# Patient Record
Sex: Female | Born: 1970 | Race: Black or African American | Hispanic: No | Smoking: Never smoker
Health system: Southern US, Community
[De-identification: ages and names within clinical notes are randomized; demographics above are authoritative.]

## PROBLEM LIST (undated history)

## (undated) DIAGNOSIS — F419 Anxiety disorder, unspecified: Secondary | ICD-10-CM

## (undated) DIAGNOSIS — G4730 Sleep apnea, unspecified: Secondary | ICD-10-CM

## (undated) DIAGNOSIS — E039 Hypothyroidism, unspecified: Secondary | ICD-10-CM

## (undated) DIAGNOSIS — I10 Essential (primary) hypertension: Secondary | ICD-10-CM

## (undated) DIAGNOSIS — M5430 Sciatica, unspecified side: Secondary | ICD-10-CM

## (undated) DIAGNOSIS — F32A Depression, unspecified: Secondary | ICD-10-CM

## (undated) DIAGNOSIS — E669 Obesity, unspecified: Secondary | ICD-10-CM

## (undated) DIAGNOSIS — Z889 Allergy status to unspecified drugs, medicaments and biological substances status: Secondary | ICD-10-CM

## (undated) HISTORY — DX: Obesity, unspecified: E66.9

## (undated) HISTORY — PX: BREAST CYST EXCISION: SHX579

## (undated) HISTORY — PX: BREAST CYST ASPIRATION: SHX578

## (undated) HISTORY — DX: Depression, unspecified: F32.A

## (undated) HISTORY — DX: Anxiety disorder, unspecified: F41.9

## (undated) HISTORY — DX: Hypothyroidism, unspecified: E03.9

## (undated) HISTORY — DX: Sleep apnea, unspecified: G47.30

---

## 2005-02-20 ENCOUNTER — Emergency Department (HOSPITAL_COMMUNITY): Admission: EM | Admit: 2005-02-20 | Discharge: 2005-02-20 | Payer: Self-pay | Admitting: Emergency Medicine

## 2005-07-20 ENCOUNTER — Emergency Department (HOSPITAL_COMMUNITY): Admission: EM | Admit: 2005-07-20 | Discharge: 2005-07-21 | Payer: Self-pay | Admitting: Emergency Medicine

## 2006-02-10 ENCOUNTER — Ambulatory Visit: Payer: Self-pay | Admitting: Family Medicine

## 2006-02-10 ENCOUNTER — Ambulatory Visit (HOSPITAL_COMMUNITY): Admission: RE | Admit: 2006-02-10 | Discharge: 2006-02-10 | Payer: Self-pay | Admitting: Family Medicine

## 2007-04-20 ENCOUNTER — Ambulatory Visit (HOSPITAL_COMMUNITY): Admission: AD | Admit: 2007-04-20 | Discharge: 2007-04-20 | Payer: Self-pay | Admitting: Obstetrics & Gynecology

## 2007-04-22 ENCOUNTER — Inpatient Hospital Stay (HOSPITAL_COMMUNITY): Admission: AD | Admit: 2007-04-22 | Discharge: 2007-04-23 | Payer: Self-pay | Admitting: Obstetrics & Gynecology

## 2007-04-28 ENCOUNTER — Inpatient Hospital Stay (HOSPITAL_COMMUNITY): Admission: AD | Admit: 2007-04-28 | Discharge: 2007-04-30 | Payer: Self-pay | Admitting: Obstetrics & Gynecology

## 2009-08-21 ENCOUNTER — Emergency Department (HOSPITAL_COMMUNITY): Admission: EM | Admit: 2009-08-21 | Discharge: 2009-08-21 | Payer: Self-pay | Admitting: Emergency Medicine

## 2010-05-19 ENCOUNTER — Emergency Department (HOSPITAL_COMMUNITY): Admission: EM | Admit: 2010-05-19 | Discharge: 2010-05-19 | Payer: Self-pay | Admitting: Family Medicine

## 2010-10-02 ENCOUNTER — Emergency Department (HOSPITAL_BASED_OUTPATIENT_CLINIC_OR_DEPARTMENT_OTHER): Admission: EM | Admit: 2010-10-02 | Discharge: 2010-10-02 | Payer: Self-pay | Admitting: Emergency Medicine

## 2011-03-01 LAB — POCT I-STAT, CHEM 8
BUN: 9 mg/dL (ref 6–23)
Calcium, Ion: 1.12 mmol/L (ref 1.12–1.32)
HCT: 40 % (ref 36.0–46.0)
Hemoglobin: 13.6 g/dL (ref 12.0–15.0)
Potassium: 3.2 mEq/L — ABNORMAL LOW (ref 3.5–5.1)
Sodium: 139 mEq/L (ref 135–145)
TCO2: 28 mmol/L (ref 0–100)

## 2011-03-01 LAB — POCT URINALYSIS DIP (DEVICE)
Glucose, UA: NEGATIVE mg/dL
Protein, ur: 100 mg/dL — AB
Specific Gravity, Urine: >=1.03 (ref 1.005–1.030)

## 2011-03-19 LAB — URINALYSIS, ROUTINE W REFLEX MICROSCOPIC: pH: 5.5 (ref 5.0–8.0)

## 2011-03-19 LAB — POCT PREGNANCY, URINE: Preg Test, Ur: NEGATIVE

## 2011-03-19 LAB — URINE MICROSCOPIC-ADD ON

## 2011-04-27 NOTE — H&P (Signed)
NAMEJAMESON, MORROW            ACCOUNT NO.:  [id number]   MEDICAL RECORD NO.:  [id number]          PATIENT TYPE:  INP   LOCATION:  9173                          FACILITY:  [facility]   PHYSICIAN:  Roseanna Rainbow, M.D.DATE OF BIRTH:  29-Jul-1971   DATE OF ADMISSION:  04/28/2007  DATE OF DISCHARGE:                              HISTORY & PHYSICAL   CHIEF COMPLAINT:  Patient is a 40 year old para 2 with an estimated date  of confinement of May 21 with an intrauterine pregnancy of 39+ weeks who  presents for induction of labor.   HISTORY OF PRESENT ILLNESS:  Please see the above.   OBSTETRICAL RISK FACTORS:  Polyhydramnios that resolved.  Obesity.  Asthma.  Hypothyroidism.   ALLERGIES:  PENICILLIN.   MEDICATIONS:  Please see the reconciliation form.   PRENATAL LABS:  Hemoglobin 11.4, hematocrit 36.7, platelets 390,000,  blood type O+, antibody screen negative.  Sickle cell negative.  RPR  nonreactive.  Hepatitis B surface antigen negative.  HIV nonreactive.  Rubella immune.  One hour GGT 123.  Urine culture and sensitivity:  No  growth.  Pap smear negative.  GC probe negative.  Chlamydia probe  negative.  Varicella immune.  Quad screen normal.   PAST GYN HISTORY:  Noncontributory.   PAST MEDICAL HISTORY:  1. Hypothyroidism.  2. Asthma.   PAST SURGICAL HISTORY:  No previous surgery.   SOCIAL HISTORY:  Employed at [employer].  Married.  Living with her  spouse.  Does not give any significant issues, alcohol usage.  Has no  significant smoking history.  Denies illicit drug use.   FAMILY HISTORY:  Hypertension, adult-onset diabetes, breast cancer.   PAST OBSTETRICAL HISTORY:  In January, 2005, she had a spontaneous  abortion.  In January, 2001, she had a spontaneous abortion of a twin  pregnancy.  In January, 1998, she had an ectopic pregnancy.  In March,  1995, she delivered a liveborn female, 8 pounds, full term vaginal  delivery.  No complications.  In September, 1992, she  delivered a 6  pound, 11 ounce female at term, vaginal delivery, no complications.   PHYSICAL EXAMINATION:  VITAL SIGNS:  Stable.  Afebrile.  Fetal heart  tracing reassuring.  Tocodynamometer:  Irregular contractions.  ABDOMEN:  Gravid.  PELVIC:  Sterile vaginal exam: 2 cm dilated/80% effaced, per the RN.   ASSESSMENT:  Multipara at term, although the pregnancy complicated by  hypothyroidism, favorable Bishop's score for induction of labor.  Fetal  heart tracing consistent with fetal well-being.  GBS positive.   PLAN:  Admission.  Low dose Pitocin.  Per protocol, to be followed by  [facility].  Ancef for GBS prophylaxis.  Continue home medications.      Roseanna Rainbow, M.D.  Electronically Signed     LAJ/MEDQ  D:  04/28/2007  T:  04/28/2007  Job:  [id number]

## 2012-05-07 ENCOUNTER — Encounter (HOSPITAL_COMMUNITY): Payer: Self-pay | Admitting: *Deleted

## 2012-05-07 ENCOUNTER — Emergency Department (INDEPENDENT_AMBULATORY_CARE_PROVIDER_SITE_OTHER)
Admission: EM | Admit: 2012-05-07 | Discharge: 2012-05-07 | Disposition: A | Discharge status: Home / Self Care | Payer: Self-pay | Source: Home / Self Care | Attending: Emergency Medicine | Admitting: Emergency Medicine

## 2012-05-07 DIAGNOSIS — J029 Acute pharyngitis, unspecified: Secondary | ICD-10-CM

## 2012-05-07 DIAGNOSIS — J329 Chronic sinusitis, unspecified: Secondary | ICD-10-CM

## 2012-05-07 HISTORY — DX: Hypothyroidism, unspecified: E03.9

## 2012-05-07 HISTORY — DX: Essential (primary) hypertension: I10

## 2012-05-07 HISTORY — DX: Allergy status to unspecified drugs, medicaments and biological substances: Z88.9

## 2012-05-07 MED ORDER — FLUTICASONE PROPIONATE 50 MCG/ACT NA SUSP: 2.0000 | Freq: Every day | NASAL | Status: DC

## 2012-05-07 MED ORDER — IBUPROFEN 600 MG PO TABS: 600.0000 mg | ORAL_TABLET | Freq: Four times a day (QID) | ORAL | Status: AC | PRN

## 2012-05-07 MED ORDER — HYDROCODONE-ACETAMINOPHEN 5-325 MG PO TABS: 2.0000 | ORAL_TABLET | ORAL | Status: AC | PRN

## 2012-05-07 NOTE — Discharge Instructions (Signed)
Take the medication as written. Return if you get worse, have a fever >100.4, or for any concerns. You may take 600 mg of motrin with 1 gram of tylenol up to 4 times a day as needed for pain. This is an effective combination for pain. Use a neti pot or the NeilMed sinus rinse as often as you want to to reduce nasal congestion. Follow the directions on the box.  ° °Go to www.goodrx.com to look up your medications. This will give you a list of where you can find your prescriptions at the most affordable prices.  °

## 2012-05-07 NOTE — ED Provider Notes (Signed)
History     CSN: [id number]  Arrival date & time 05/07/12  1538   First MD Initiated Contact with Patient 05/07/12 1557      Chief Complaint  Patient presents with  . Nasal Congestion  . Cough  . Sore Throat  . Chills  . Fever    (Consider location/radiation/quality/duration/timing/severity/associated sxs/prior treatment) HPI Comments: Pt with rhinorrhea, postnasal drip, ST, nonproductive cough, raspy voice, fatigue, maxillary sinus pressure x 5 days. Reports fevers up beginning in illness, Tmax 101.  Reports burning sensation in chest with coughing. Some wheezing at night. No drooling, trismus, sensation of throat swelling shut. She's been taking over-the-counter medications without relief. No aggravating factors. No neck pain, headache, rash. No ear pain, SOB, abd pain, rash, N/V. Slightly decreased appetite but is tolerating po.  No known sick contacts. She has a history of asthma and seasonal allergies.     ROS as noted in HPI. All other ROS negative.     Patient is a 41 y.o. female presenting with pharyngitis and fever. The history is provided by the patient. No language interpreter was used.  Sore Throat This is a new problem. The current episode started more than 2 days ago. The problem occurs constantly. The problem has not changed since onset.The symptoms are aggravated by swallowing. The symptoms are relieved by nothing. She has tried rest for the symptoms.  Fever Primary symptoms of the febrile illness include fever.    Past Medical History  Diagnosis Date  . Hypertension   . Asthma   . H/O seasonal allergies   . Hypothyroid     History reviewed. No pertinent past surgical history.  History reviewed. No pertinent family history.  History  Substance Use Topics  . Smoking status: Never Smoker   . Smokeless tobacco: Not on file  . Alcohol Use: No    OB History    Grav Para Term Preterm Abortions TAB SAB Ect Mult Living                  Review of  Systems  Constitutional: Positive for fever.    Allergies  Penicillins  Home Medications   Current Outpatient Rx  Name Route Sig Dispense Refill  . ALBUTEROL SULFATE HFA 108 (90 BASE) MCG/ACT IN AERS Inhalation Inhale 2 puffs into the lungs every 6 (six) hours as needed.    Marland Kitchen BIOTIN 5000 MCG PO TABS Oral Take by mouth.    Marland Kitchen HYDROCHLOROTHIAZIDE 25 MG PO TABS Oral Take 25 mg by mouth daily.    . IBUPROFEN 600 MG PO TABS Oral Take 600 mg by mouth every 6 (six) hours as needed.    Marland Kitchen LEVOTHYROXINE SODIUM 200 MCG PO TABS Oral Take 200 mcg by mouth daily.    Marland Kitchen OVER THE COUNTER MEDICATION  Throat spray    . PHENTERMINE HCL 37.5 MG PO CAPS Oral Take 37.5 mg by mouth every morning.    Marland Kitchen FLUTICASONE PROPIONATE 50 MCG/ACT NA SUSP Nasal Place 2 sprays into the nose daily. 16 g 0  . HYDROCODONE-ACETAMINOPHEN 5-325 MG PO TABS Oral Take 2 tablets by mouth every 4 (four) hours as needed for pain. 20 tablet 0  . IBUPROFEN 600 MG PO TABS Oral Take 1 tablet (600 mg total) by mouth every 6 (six) hours as needed for pain. 30 tablet 0    BP 124/85  Pulse 88  Temp(Src) 98.5 F (36.9 C) (Oral)  Resp 18  SpO2 100%  Physical Exam  Nursing note and  vitals reviewed. Constitutional: She is oriented to person, place, and time. She appears well-developed and well-nourished.  HENT:  Head: Normocephalic and atraumatic. No trismus in the jaw.  Right Ear: Tympanic membrane and ear canal normal.  Left Ear: Tympanic membrane and ear canal normal.  Nose: Mucosal edema and rhinorrhea present. Right sinus exhibits maxillary sinus tenderness. Right sinus exhibits no frontal sinus tenderness. Left sinus exhibits maxillary sinus tenderness. Left sinus exhibits no frontal sinus tenderness.  Mouth/Throat: Uvula is midline and mucous membranes are normal. Normal dentition. Posterior oropharyngeal erythema present. No oropharyngeal exudate.       No purulent nasal drainage. Enlarged, erythematous tonsils. No palatal  petechiae  Eyes: Conjunctivae and EOM are normal. Pupils are equal, round, and reactive to light.  Neck: Normal range of motion. Neck supple.  Cardiovascular: Normal rate, regular rhythm and normal heart sounds.   Pulmonary/Chest: Effort normal and breath sounds normal.  Abdominal: Bowel sounds are normal. She exhibits no distension.  Musculoskeletal: Normal range of motion.  Lymphadenopathy:    She has cervical adenopathy.  Neurological: She is alert and oriented to person, place, and time.  Skin: Skin is warm and dry. No rash noted.  Psychiatric: She has a normal mood and affect. Her behavior is normal. Judgment and thought content normal.    ED Course  Procedures (including critical care time)   Labs Reviewed  POCT RAPID STREP A ([facility] ONLY)   No results found.   1. Pharyngitis   2. Sinusitis     Results for orders placed during the hospital encounter of 05/07/12  POCT RAPID STREP A ([facility] ONLY)      Component Value Range   Streptococcus, Group A Screen (Direct) NEGATIVE  NEGATIVE      MDM  Strep negative. Symptoms most likely from postnasal drip. Patient also has a lot of nasal congestion, mild bilateral maxillary sinus tenderness. No fevers in several days,  Tmax was 101. Is 5 days into illness, will treat as if viral at this point with increase fluids, Mucinex, saline nasal irrigation, Flonase. She'll followup with her primary care physician for antibiotics if she does not improve in 5 days. She agrees with this plan  Luiz Blare, MD 05/07/12 1758

## 2012-05-07 NOTE — ED Notes (Addendum)
Pt with c/o sore throat/congestion/chills/fever/chest sore with deep breath and coughing - difficulty swallowing - onset x 6 days - cough mild and intermittent  -

## 2013-06-14 DIAGNOSIS — E039 Hypothyroidism, unspecified: Secondary | ICD-10-CM | POA: Insufficient documentation

## 2013-08-23 DIAGNOSIS — D649 Anemia, unspecified: Secondary | ICD-10-CM | POA: Insufficient documentation

## 2013-11-23 DIAGNOSIS — I10 Essential (primary) hypertension: Secondary | ICD-10-CM | POA: Insufficient documentation

## 2014-12-15 ENCOUNTER — Emergency Department (HOSPITAL_COMMUNITY)
Admission: EM | Admit: 2014-12-15 | Discharge: 2014-12-15 | Disposition: A | Discharge status: Home / Self Care | Payer: Medicaid Other | Attending: Emergency Medicine | Admitting: Emergency Medicine

## 2014-12-15 ENCOUNTER — Encounter (HOSPITAL_COMMUNITY): Payer: Self-pay | Admitting: Emergency Medicine

## 2014-12-15 DIAGNOSIS — E039 Hypothyroidism, unspecified: Secondary | ICD-10-CM | POA: Diagnosis not present

## 2014-12-15 DIAGNOSIS — J45909 Unspecified asthma, uncomplicated: Secondary | ICD-10-CM | POA: Insufficient documentation

## 2014-12-15 DIAGNOSIS — I10 Essential (primary) hypertension: Secondary | ICD-10-CM | POA: Diagnosis not present

## 2014-12-15 DIAGNOSIS — R300 Dysuria: Secondary | ICD-10-CM | POA: Diagnosis present

## 2014-12-15 DIAGNOSIS — Z79899 Other long term (current) drug therapy: Secondary | ICD-10-CM | POA: Insufficient documentation

## 2014-12-15 DIAGNOSIS — Z880 Allergy status to penicillin: Secondary | ICD-10-CM | POA: Insufficient documentation

## 2014-12-15 DIAGNOSIS — N390 Urinary tract infection, site not specified: Secondary | ICD-10-CM | POA: Insufficient documentation

## 2014-12-15 LAB — URINALYSIS, ROUTINE W REFLEX MICROSCOPIC
Bilirubin Urine: NEGATIVE
Glucose, UA: NEGATIVE mg/dL
Ketones, ur: NEGATIVE mg/dL
Nitrite: NEGATIVE
Protein, ur: 100 mg/dL — AB
Specific Gravity, Urine: 1.015 (ref 1.005–1.030)
Urobilinogen, UA: 1 mg/dL (ref 0.0–1.0)
pH: 6.5 (ref 5.0–8.0)

## 2014-12-15 LAB — URINE MICROSCOPIC-ADD ON

## 2014-12-15 MED ORDER — OXYCODONE-ACETAMINOPHEN 5-325 MG PO TABS
2.0000 | ORAL_TABLET | Freq: Once | ORAL | Status: AC
  Administered 2014-12-15: 1 via ORAL
  Filled 2014-12-15: qty 2

## 2014-12-15 MED ORDER — CIPROFLOXACIN HCL 500 MG PO TABS
500.0000 mg | ORAL_TABLET | Freq: Once | ORAL | Status: AC
  Administered 2014-12-15: 500 mg via ORAL
  Filled 2014-12-15: qty 1

## 2014-12-15 MED ORDER — IBUPROFEN 200 MG PO TABS
600.0000 mg | ORAL_TABLET | Freq: Once | ORAL | Status: AC
  Administered 2014-12-15: 600 mg via ORAL
  Filled 2014-12-15: qty 3

## 2014-12-15 MED ORDER — CIPROFLOXACIN HCL 500 MG PO TABS
500.0000 mg | ORAL_TABLET | Freq: Two times a day (BID) | ORAL | Status: DC
Start: 2014-12-15 — End: 2015-09-25

## 2014-12-15 NOTE — ED Notes (Signed)
Pt states that she noticed blood in her urine while at work last night with dysuria. Pt states that she has an IUD and will have periodic bleeding.

## 2014-12-15 NOTE — ED Notes (Addendum)
Pt only given 1 Percocet because she "has a low tolerance for medication." Sts that she may want the second pill later. Pt is A&O and in NAD

## 2014-12-15 NOTE — ED Provider Notes (Signed)
CSN: [id number]     Arrival date & time 12/15/14  4782 History   First MD Initiated Contact with Patient 12/15/14 1014     Chief Complaint  Patient presents with  . Hematuria  . Dysuria     (Consider location/radiation/quality/duration/timing/severity/associated sxs/prior Treatment) HPI   44 year old female with lower abdominal pain. Gradual onset 2 days ago. Pain is relatively constant. Suprapubic and also feels it in her lower back. Does not lateralize. Yesterday she began having some dysuria and also noted hematuria. No fevers or chills. No nausea or vomiting. No vaginal discharge.  Past Medical History  Diagnosis Date  . Hypertension   . Asthma   . H/O seasonal allergies   . Hypothyroid    History reviewed. No pertinent past surgical history. No family history on file. History  Substance Use Topics  . Smoking status: Never Smoker   . Smokeless tobacco: Not on file  . Alcohol Use: No   OB History    No data available     Review of Systems  All systems reviewed and negative, other than as noted in HPI.   Allergies  Penicillins  Home Medications   Prior to Admission medications   Medication Sig Start Date End Date Taking? Authorizing Provider  Biotin 5000 MCG CAPS Take 5,000 mcg by mouth daily.   Yes Historical Provider, MD  hydrochlorothiazide (HYDRODIURIL) 25 MG tablet Take 25 mg by mouth daily.   Yes Historical Provider, MD  ibuprofen (ADVIL,MOTRIN) 200 MG tablet Take 200 mg by mouth every 6 (six) hours as needed for moderate pain.   Yes Historical Provider, MD  levothyroxine (SYNTHROID, LEVOTHROID) 175 MCG tablet Take 175 mcg by mouth daily before breakfast.   Yes Historical Provider, MD  loratadine (CLARITIN) 10 MG tablet Take 10 mg by mouth daily.   Yes Historical Provider, MD  Multiple Vitamin (MULTIVITAMIN WITH MINERALS) TABS tablet Take 1 tablet by mouth daily.   Yes Historical Provider, MD  ciprofloxacin (CIPRO) 500 MG tablet Take 1 tablet (500 mg total)  by mouth every 12 (twelve) hours. 12/15/14   Raeford Razor, MD   BP 126/81 mmHg  Pulse 67  Temp(Src) 97.6 F (36.4 C) (Oral)  Resp 16  SpO2 99% Physical Exam  Constitutional: She appears well-developed and well-nourished. No distress.  HENT:  Head: Normocephalic and atraumatic.  Eyes: Conjunctivae are normal. Right eye exhibits no discharge. Left eye exhibits no discharge.  Neck: Neck supple.  Cardiovascular: Normal rate, regular rhythm and normal heart sounds.  Exam reveals no gallop and no friction rub.   No murmur heard. Pulmonary/Chest: Effort normal and breath sounds normal. No respiratory distress.  Abdominal: Soft. She exhibits no distension. There is no tenderness.  Genitourinary:  No cva tenderness  Musculoskeletal: She exhibits no edema or tenderness.  Neurological: She is alert.  Skin: Skin is warm and dry.  Psychiatric: She has a normal mood and affect. Her behavior is normal. Thought content normal.  Nursing note and vitals reviewed.   ED Course  Procedures (including critical care time) Labs Review Labs Reviewed  URINALYSIS, ROUTINE W REFLEX MICROSCOPIC - Abnormal; Notable for the following:    APPearance TURBID (*)    Hgb urine dipstick LARGE (*)    Protein, ur 100 (*)    Leukocytes, UA LARGE (*)    All other components within normal limits  URINE MICROSCOPIC-ADD ON - Abnormal; Notable for the following:    Bacteria, UA MANY (*)    All other components within normal  limits  URINE CULTURE    Imaging Review No results found.   EKG Interpretation None      MDM   Final diagnoses:  UTI (lower urinary tract infection)    44 year old female with symptoms and urinalysis consistent with UTI. Afebrile. Benign abdominal exam. We'll send urine culture. Penicillin allergy. Ciprofloxacin prescribed. Return precautions discussed.    Raeford Razor, MD 12/15/14 1218

## 2014-12-15 NOTE — Discharge Instructions (Signed)

## 2014-12-18 LAB — URINE CULTURE: Colony Count: >=100000

## 2014-12-19 ENCOUNTER — Telehealth (HOSPITAL_BASED_OUTPATIENT_CLINIC_OR_DEPARTMENT_OTHER): Payer: Self-pay | Admitting: Emergency Medicine

## 2014-12-19 NOTE — Telephone Encounter (Signed)
Post ED Visit - Positive Culture Follow-up  Culture report reviewed by antimicrobial stewardship pharmacist: []  Wes Dulaney, Pharm.D., BCPS []  Celedonio MiyamotoJeremy Frens, Pharm.D., BCPS [x]  Georgina PillionElizabeth Martin, Pharm.D., BCPS []  ArbyrdMinh Pham, [address]Ph[address]D., BCPS, AAHIVP []  Estella HuskMichelle Turner, Pharm.D., BCPS, AAHIVP []  Elder CyphersLorie Poole, [address]Pharm[address]D., BCPS  Positive urine culture Proteus Treated with ciprofloxacin, organism sensitive to the same and no further patient follow-up is required at this time.  Berle MullMiller, Gizelle Whetsel 12/19/2014, 11:20 AM

## 2015-07-21 ENCOUNTER — Ambulatory Visit: Payer: Medicaid Other | Admitting: Obstetrics

## 2015-07-30 ENCOUNTER — Ambulatory Visit: Payer: Medicaid Other | Admitting: Obstetrics

## 2015-08-05 ENCOUNTER — Ambulatory Visit (INDEPENDENT_AMBULATORY_CARE_PROVIDER_SITE_OTHER): Payer: Medicaid Other | Admitting: Certified Nurse Midwife

## 2015-08-05 ENCOUNTER — Encounter: Payer: Self-pay | Admitting: Certified Nurse Midwife

## 2015-08-05 VITALS — BP 123/83 | HR 79 | Temp 97.9°F | Wt 342.0 lb

## 2015-08-05 DIAGNOSIS — Z113 Encounter for screening for infections with a predominantly sexual mode of transmission: Secondary | ICD-10-CM | POA: Diagnosis not present

## 2015-08-05 DIAGNOSIS — A499 Bacterial infection, unspecified: Secondary | ICD-10-CM | POA: Diagnosis not present

## 2015-08-05 DIAGNOSIS — Z0000 Encounter for general adult medical examination without abnormal findings: Secondary | ICD-10-CM | POA: Diagnosis not present

## 2015-08-05 DIAGNOSIS — B9689 Other specified bacterial agents as the cause of diseases classified elsewhere: Secondary | ICD-10-CM

## 2015-08-05 DIAGNOSIS — N760 Acute vaginitis: Secondary | ICD-10-CM | POA: Diagnosis not present

## 2015-08-05 DIAGNOSIS — Z01419 Encounter for gynecological examination (general) (routine) without abnormal findings: Secondary | ICD-10-CM

## 2015-08-05 LAB — CBC WITH DIFFERENTIAL/PLATELET
Basophils Absolute: 0 10*3/uL (ref 0.0–0.1)
Basophils Relative: 0 % (ref 0–1)
EOS ABS: 0.2 10*3/uL (ref 0.0–0.7)
Eosinophils Relative: 3 % (ref 0–5)
HEMATOCRIT: 40 % (ref 36.0–46.0)
HEMOGLOBIN: 13 g/dL (ref 12.0–15.0)
LYMPHS ABS: 2.8 10*3/uL (ref 0.7–4.0)
LYMPHS PCT: 39 % (ref 12–46)
MCH: 28 pg (ref 26.0–34.0)
MCHC: 32.5 g/dL (ref 30.0–36.0)
MCV: 86 fL (ref 78.0–100.0)
MONOS PCT: 7 % (ref 3–12)
MPV: 9.9 fL (ref 8.6–12.4)
Monocytes Absolute: 0.5 10*3/uL (ref 0.1–1.0)
NEUTROS PCT: 51 % (ref 43–77)
Neutro Abs: 3.7 10*3/uL (ref 1.7–7.7)
PLATELETS: 301 10*3/uL (ref 150–400)
RBC: 4.65 MIL/uL (ref 3.87–5.11)
RDW: 14.4 % (ref 11.5–15.5)
WBC: 7.2 10*3/uL (ref 4.0–10.5)

## 2015-08-05 LAB — COMPREHENSIVE METABOLIC PANEL
ALT: 14 U/L (ref 6–29)
AST: 15 U/L (ref 10–30)
Albumin: 3.8 g/dL (ref 3.6–5.1)
Alkaline Phosphatase: 59 U/L (ref 33–115)
BUN: 10 mg/dL (ref 7–25)
CHLORIDE: 99 mmol/L (ref 98–110)
CO2: 25 mmol/L (ref 20–31)
Calcium: 8.9 mg/dL (ref 8.6–10.2)
Creat: 0.56 mg/dL (ref 0.50–1.10)
GLUCOSE: 95 mg/dL (ref 65–99)
POTASSIUM: 3.8 mmol/L (ref 3.5–5.3)
Sodium: 139 mmol/L (ref 135–146)
Total Bilirubin: 0.3 mg/dL (ref 0.2–1.2)
Total Protein: 7.1 g/dL (ref 6.1–8.1)

## 2015-08-05 LAB — CHOLESTEROL, TOTAL: Cholesterol: 150 mg/dL (ref 125–200)

## 2015-08-05 LAB — HDL CHOLESTEROL: HDL: 50 mg/dL (ref >=46)

## 2015-08-05 LAB — HEMOGLOBIN A1C
HEMOGLOBIN A1C: 6.1 % — AB (ref <5.7)
MEAN PLASMA GLUCOSE: 128 mg/dL — AB (ref <117)

## 2015-08-05 LAB — TRIGLYCERIDES: Triglycerides: 75 mg/dL (ref <150)

## 2015-08-05 MED ORDER — METRONIDAZOLE 500 MG PO TABS
500.0000 mg | ORAL_TABLET | Freq: Two times a day (BID) | ORAL | Status: DC
Start: 2015-08-05 — End: 2018-04-05

## 2015-08-05 MED ORDER — VITAFOL ULTRA 29-0.6-0.4-200 MG PO CAPS: 1.0000 | ORAL_CAPSULE | Freq: Every day | ORAL | Status: DC

## 2015-08-05 MED ORDER — PHENTERMINE HCL 37.5 MG PO TABS: 37.5000 mg | ORAL_TABLET | Freq: Every day | ORAL | Status: DC
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
## 2015-08-06 LAB — HIV ANTIBODY (ROUTINE TESTING W REFLEX): HIV 1&2 Ab, 4th Generation: NONREACTIVE

## 2015-08-06 LAB — HEPATITIS C ANTIBODY: HCV Ab: NEGATIVE

## 2015-08-06 LAB — HEPATITIS B SURFACE ANTIGEN: Hepatitis B Surface Ag: NEGATIVE

## 2015-08-06 LAB — RPR

## 2015-08-08 LAB — SURESWAB, VAGINOSIS/VAGINITIS PLUS
ATOPOBIUM VAGINAE: 6.6 Log (cells/mL)
C. ALBICANS, DNA: NOT DETECTED
C. GLABRATA, DNA: NOT DETECTED
C. PARAPSILOSIS, DNA: NOT DETECTED
C. TRACHOMATIS RNA, TMA: NOT DETECTED
C. tropicalis, DNA: NOT DETECTED
Gardnerella vaginalis: >8 Log (cells/mL)
LACTOBACILLUS SPECIES: NOT DETECTED Log (cells/mL)
MEGASPHAERA SPECIES: >8 Log (cells/mL)
N. GONORRHOEAE RNA, TMA: NOT DETECTED
T. VAGINALIS RNA, QL TMA: DETECTED — AB

## 2015-08-08 LAB — PAP, TP IMAGING W/ HPV RNA, RFLX HPV TYPE 16,18/45: HPV mRNA, High Risk: NOT DETECTED

## 2015-08-11 ENCOUNTER — Ambulatory Visit: Payer: Medicaid Other

## 2015-09-01 ENCOUNTER — Ambulatory Visit: Payer: Medicaid Other

## 2015-09-09 ENCOUNTER — Ambulatory Visit: Payer: Medicaid Other

## 2015-09-23 ENCOUNTER — Ambulatory Visit: Payer: Medicaid Other

## 2015-09-25 ENCOUNTER — Encounter (HOSPITAL_COMMUNITY): Payer: Self-pay | Admitting: Emergency Medicine

## 2015-09-25 ENCOUNTER — Emergency Department (INDEPENDENT_AMBULATORY_CARE_PROVIDER_SITE_OTHER): Payer: Medicaid Other

## 2015-09-25 ENCOUNTER — Emergency Department (HOSPITAL_COMMUNITY)
Admission: EM | Admit: 2015-09-25 | Discharge: 2015-09-25 | Disposition: A | Discharge status: Home / Self Care | Payer: Medicaid Other | Source: Home / Self Care | Attending: Emergency Medicine | Admitting: Emergency Medicine

## 2015-09-25 DIAGNOSIS — S29012A Strain of muscle and tendon of back wall of thorax, initial encounter: Secondary | ICD-10-CM | POA: Diagnosis not present

## 2015-09-25 MED ORDER — CYCLOBENZAPRINE HCL 5 MG PO TABS: 5.0000 mg | ORAL_TABLET | Freq: Every day | ORAL | Status: DC

## 2015-09-25 MED ORDER — PREDNISONE 50 MG PO TABS: ORAL_TABLET | ORAL | Status: DC

## 2015-09-25 MED ORDER — TRAMADOL HCL 50 MG PO TABS: 50.0000 mg | ORAL_TABLET | Freq: Four times a day (QID) | ORAL | Status: DC | PRN

## 2015-09-25 NOTE — ED Provider Notes (Signed)
CSN: [id number]     Arrival date & time 09/25/15  1649 History   First MD Initiated Contact with Patient 09/25/15 1810     Chief Complaint  Patient presents with  . Shoulder Pain   (Consider location/radiation/quality/duration/timing/severity/associated sxs/prior Treatment) HPI She is a 44 year old woman here for evaluation of upper back pain. She states this started 3 days ago. She denies any injury or trauma. No new activities or heavy lifting. She states it is very sore across the top of her back. It also wraps around under her left arm and into the left chest wall. No radiating pain down the arms. No numbness, tingling, weakness. She does report an intermittent cough, which makes the pain worse. No shortness of breath. She reports a subjective fever yesterday. No rashes. No leg pain or swelling. No prolonged immobilization. No oral estrogen.  She has taken some ibuprofen with temporary improvement.  Past Medical History  Diagnosis Date  . Hypertension   . Asthma   . H/O seasonal allergies   . Hypothyroid    History reviewed. No pertinent past surgical history. History reviewed. No pertinent family history. Social History  Substance Use Topics  . Smoking status: Never Smoker   . Smokeless tobacco: None  . Alcohol Use: No   OB History    No data available     Review of Systems As in history of present illness Allergies  Penicillins  Home Medications   Prior to Admission medications   Medication Sig Start Date End Date Taking? Authorizing Provider  Biotin 5000 MCG CAPS Take 5,000 mcg by mouth daily.    Historical Provider, MD  cyclobenzaprine (FLEXERIL) 5 MG tablet Take 1-2 tablets (5-10 mg total) by mouth at bedtime. 09/25/15   Charm RingsErin J Kazuo Durnil, MD  hydrochlorothiazide (HYDRODIURIL) 25 MG tablet Take 25 mg by mouth daily.    Historical Provider, MD  ibuprofen (ADVIL,MOTRIN) 200 MG tablet Take 200 mg by mouth every 6 (six) hours as needed for moderate pain.    Historical  Provider, MD  levothyroxine (SYNTHROID, LEVOTHROID) 175 MCG tablet Take 175 mcg by mouth daily before breakfast.    Historical Provider, MD  levothyroxine (SYNTHROID, LEVOTHROID) 50 MCG tablet Take 250 mcg by mouth daily before breakfast.    Historical Provider, MD  loratadine (CLARITIN) 10 MG tablet Take 10 mg by mouth daily.    Historical Provider, MD  metroNIDAZOLE (FLAGYL) 500 MG tablet Take 1 tablet (500 mg total) by mouth 2 (two) times daily. 08/05/15   Roe Coombsachelle A Denney, CNM  Multiple Vitamin (MULTIVITAMIN WITH MINERALS) TABS tablet Take 1 tablet by mouth daily.    Historical Provider, MD  phentermine (ADIPEX-P) 37.5 MG tablet Take 1 tablet (37.5 mg total) by mouth daily before breakfast. 08/05/15   Rachelle A Denney, CNM  predniSONE (DELTASONE) 50 MG tablet Take 1 pill daily for 5 days. 09/25/15   Charm RingsErin J Mylez Venable, MD  Prenat-Fe Poly-Methfol-FA-DHA (VITAFOL ULTRA) 29-0.6-0.4-200 MG CAPS Take 1 tablet by mouth daily at 10 pm. 08/05/15   Roe Coombsachelle A Denney, CNM  traMADol (ULTRAM) 50 MG tablet Take 1 tablet (50 mg total) by mouth every 6 (six) hours as needed. 09/25/15   Charm RingsErin J Lillah Standre, MD   Meds Ordered and Administered this Visit  Medications - No data to display  BP 168/80 mmHg  Pulse 94  Temp(Src) 98.3 F (36.8 C) (Oral)  Resp 16  SpO2 98% No data found.   Physical Exam  Constitutional: She is oriented to person, place, and  time. She appears well-developed and well-nourished. No distress.  Neck: Neck supple.  Cardiovascular: Normal rate, regular rhythm and normal heart sounds.   No murmur heard. Pulmonary/Chest: Effort normal and breath sounds normal. No respiratory distress. She has no wheezes. She has no rales.  Musculoskeletal: She exhibits no edema or tenderness.       Back:       Arms: No vertebral tenderness or step-offs. 5 out of 5 strength in abduction and grip bilaterally.  Areas of tenderness to palpation outlined in red.  Neurological: She is alert and oriented to person,  place, and time.    ED Course  Procedures (including critical care time)  Labs Review Labs Reviewed - No data to display  Imaging Review Dg Chest 2 View  09/25/2015  CLINICAL DATA:  44 year old female with cough fever and spine pain for 4 days. Initial encounter. EXAM: CHEST  2 VIEW COMPARISON:  None. FINDINGS: Large body habitus. Cardiomegaly. Other mediastinal contours are within normal limits. Somewhat low lung volumes, especially on the lateral view. No pneumothorax, pulmonary edema, pleural effusion or confluent pulmonary opacity. No acute osseous abnormality identified. IMPRESSION: Cardiomegaly and low lung volumes. No acute cardiopulmonary abnormality. Electronically Signed   By: Odessa Fleming M.D.   On: 09/25/2015 18:58      MDM   1. Muscle strain of left upper back, initial encounter   2. Muscle strain of right upper back, initial encounter    Exam is consistent with muscle tenderness.  No history or exam findings concerning for PE or lung pathology. Treat with prednisone, Flexeril, and tramadol as needed. I discussed her x-ray results showing cardiomegaly. Recommended that she follow-up with her primary care doctor to see if he wants to do an ultrasound.  Charm Rings, MD 09/25/15 [phone]

## 2015-09-25 NOTE — Discharge Instructions (Signed)
Your pain is coming from the muscles of your upper back and rib cage. Apply heat several times a day. Take prednisone daily for 5 days. This is to help get rid of any inflammation. Use ibuprofen as needed during the day. Take Flexeril 1-2 tablets at bedtime for muscle spasm. This medicine will make you sleepy. Use the tramadol every 6-8 hours as needed for pain. Do not drive for taking this medicine. Follow-up as needed.  Please follow-up with your primary care doctor in the next few weeks as you are chest x-ray showed an enlarged heart. Your doctor may want to get an ultrasound.

## 2015-09-25 NOTE — ED Notes (Signed)
C/o bilateral back pain radiating to shoulders for a few days  Ibuprofen used as tx Denies any injury

## 2017-01-17 ENCOUNTER — Ambulatory Visit: Payer: Medicaid Other | Admitting: Obstetrics and Gynecology

## 2017-03-14 ENCOUNTER — Encounter (HOSPITAL_COMMUNITY): Payer: Self-pay | Admitting: Emergency Medicine

## 2017-03-14 DIAGNOSIS — E039 Hypothyroidism, unspecified: Secondary | ICD-10-CM | POA: Diagnosis not present

## 2017-03-14 DIAGNOSIS — M545 Low back pain: Secondary | ICD-10-CM | POA: Diagnosis present

## 2017-03-14 DIAGNOSIS — J45909 Unspecified asthma, uncomplicated: Secondary | ICD-10-CM | POA: Insufficient documentation

## 2017-03-14 DIAGNOSIS — M5441 Lumbago with sciatica, right side: Secondary | ICD-10-CM | POA: Insufficient documentation

## 2017-03-14 DIAGNOSIS — I10 Essential (primary) hypertension: Secondary | ICD-10-CM | POA: Insufficient documentation

## 2017-03-14 DIAGNOSIS — Z79899 Other long term (current) drug therapy: Secondary | ICD-10-CM | POA: Diagnosis not present

## 2017-03-14 DIAGNOSIS — M5442 Lumbago with sciatica, left side: Secondary | ICD-10-CM | POA: Insufficient documentation

## 2017-03-14 MED ORDER — OXYCODONE-ACETAMINOPHEN 5-325 MG PO TABS
1.0000 | ORAL_TABLET | Freq: Once | ORAL | Status: AC
  Administered 2017-03-14: 1 via ORAL

## 2017-03-14 MED ORDER — OXYCODONE-ACETAMINOPHEN 5-325 MG PO TABS
ORAL_TABLET | ORAL | Status: AC
  Filled 2017-03-14: qty 1

## 2017-03-14 NOTE — ED Triage Notes (Signed)
Pt. reports low back pain radiating down to both legs onset today , denies injury or fall , pain increases with movement / changing positions , denies hematuria or dysuria . Pt. stated history of sciatica unrelieved by OTC Aleve .

## 2017-03-15 ENCOUNTER — Emergency Department (HOSPITAL_COMMUNITY): Payer: BC Managed Care – PPO

## 2017-03-15 ENCOUNTER — Emergency Department (HOSPITAL_COMMUNITY)
Admission: EM | Admit: 2017-03-15 | Discharge: 2017-03-15 | Disposition: A | Discharge status: Home / Self Care | Payer: BC Managed Care – PPO | Attending: Emergency Medicine | Admitting: Emergency Medicine

## 2017-03-15 DIAGNOSIS — M5442 Lumbago with sciatica, left side: Secondary | ICD-10-CM

## 2017-03-15 DIAGNOSIS — M5441 Lumbago with sciatica, right side: Secondary | ICD-10-CM

## 2017-03-15 HISTORY — DX: Sciatica, unspecified side: M54.30

## 2017-03-15 MED ORDER — HYDROMORPHONE HCL 1 MG/ML IJ SOLN
0.5000 mg | Freq: Once | INTRAMUSCULAR | Status: AC
  Administered 2017-03-15: 1 mg via INTRAVENOUS
  Filled 2017-03-15: qty 1

## 2017-03-15 MED ORDER — ONDANSETRON HCL 4 MG/2ML IJ SOLN
4.0000 mg | Freq: Once | INTRAMUSCULAR | Status: AC
  Administered 2017-03-15: 4 mg via INTRAVENOUS
  Filled 2017-03-15: qty 2

## 2017-03-15 MED ORDER — METHOCARBAMOL 500 MG PO TABS: 1000.0000 mg | ORAL_TABLET | Freq: Four times a day (QID) | ORAL | 0 refills | Status: DC

## 2017-03-15 MED ORDER — METHOCARBAMOL 500 MG PO TABS
500.0000 mg | ORAL_TABLET | Freq: Once | ORAL | Status: AC
  Administered 2017-03-15: 500 mg via ORAL
  Filled 2017-03-15: qty 1

## 2017-03-15 MED ORDER — PREDNISONE 20 MG PO TABS
60.0000 mg | ORAL_TABLET | Freq: Once | ORAL | Status: AC
  Administered 2017-03-15: 60 mg via ORAL
  Filled 2017-03-15: qty 3

## 2017-03-15 MED ORDER — KETOROLAC TROMETHAMINE 30 MG/ML IJ SOLN
30.0000 mg | Freq: Once | INTRAMUSCULAR | Status: AC
  Administered 2017-03-15: 30 mg via INTRAVENOUS
  Filled 2017-03-15: qty 1

## 2017-03-15 MED ORDER — HYDROMORPHONE HCL 1 MG/ML IJ SOLN
0.5000 mg | Freq: Once | INTRAMUSCULAR | Status: AC
  Administered 2017-03-15: 0.5 mg via INTRAVENOUS
  Filled 2017-03-15: qty 1

## 2017-03-15 MED ORDER — TRAMADOL HCL 50 MG PO TABS: 50.0000 mg | ORAL_TABLET | Freq: Four times a day (QID) | ORAL | 0 refills | Status: DC | PRN

## 2017-03-15 MED ORDER — NAPROXEN 500 MG PO TABS: 500.0000 mg | ORAL_TABLET | Freq: Two times a day (BID) | ORAL | 0 refills | Status: DC

## 2017-03-15 MED ORDER — PREDNISONE 20 MG PO TABS: ORAL_TABLET | ORAL | 0 refills | Status: DC

## 2017-03-15 NOTE — ED Provider Notes (Signed)
[facility]-EMERGENCY DEPT Provider Note   CSN: [id number] Arrival date & time: 03/14/17  2228     History   Chief Complaint Chief Complaint  Patient presents with  . Back Pain    Sciatica    HPI April Ortega is a 46 y.o. female.  Patient presents with complaint of gradual onset, acute, sharp, low back pain starting yesterday. Pain was milder and worsened throughout the day. It radiated into both buttocks and down both legs but worse on the right. Patient did not have any preceding trauma. Pain is worse with movements, standing, walking. The pain was so bad that she could not walk and had to call an ambulance. She denies urinary symptoms. She has taken over-the-counter NSAIDs without relief. She had 1 previous episode of sciatica while she was pregnant. No other history of back problems. Patient denies warning symptoms of back pain including: fecal incontinence, urinary retention or overflow incontinence, night sweats, waking from sleep with back pain, unexplained fevers or weight loss, h/o cancer, IVDU, recent trauma. No history of diabetes or immunocompromise.       Past Medical History:  Diagnosis Date  . Asthma   . H/O seasonal allergies   . Hypertension   . Hypothyroid   . Sciatica     Patient Active Problem List   Diagnosis Date Noted  . Morbid obesity (HCC) 08/05/2015    History reviewed. No pertinent surgical history.  OB History    No data available       Home Medications    Prior to Admission medications   Medication Sig Start Date End Date Taking? Authorizing Provider  Biotin 5000 MCG CAPS Take 5,000 mcg by mouth daily.    Historical Provider, MD  cyclobenzaprine (FLEXERIL) 5 MG tablet Take 1-2 tablets (5-10 mg total) by mouth at bedtime. 09/25/15   Charm Rings, MD  hydrochlorothiazide (HYDRODIURIL) 25 MG tablet Take 25 mg by mouth daily.    Historical Provider, MD  ibuprofen (ADVIL,MOTRIN) 200 MG tablet Take 200 mg by mouth every 6 (six) hours as needed  for moderate pain.    Historical Provider, MD  levothyroxine (SYNTHROID, LEVOTHROID) 175 MCG tablet Take 175 mcg by mouth daily before breakfast.    Historical Provider, MD  levothyroxine (SYNTHROID, LEVOTHROID) 50 MCG tablet Take 250 mcg by mouth daily before breakfast.    Historical Provider, MD  loratadine (CLARITIN) 10 MG tablet Take 10 mg by mouth daily.    Historical Provider, MD  metroNIDAZOLE (FLAGYL) 500 MG tablet Take 1 tablet (500 mg total) by mouth 2 (two) times daily. 08/05/15   Roe Coombs, CNM  Multiple Vitamin (MULTIVITAMIN WITH MINERALS) TABS tablet Take 1 tablet by mouth daily.    Historical Provider, MD  phentermine (ADIPEX-P) 37.5 MG tablet Take 1 tablet (37.5 mg total) by mouth daily before breakfast. 08/05/15   Rachelle A Denney, CNM  predniSONE (DELTASONE) 50 MG tablet Take 1 pill daily for 5 days. 09/25/15   Charm Rings, MD  Prenat-Fe Poly-Methfol-FA-DHA (VITAFOL ULTRA) 29-0.6-0.4-200 MG CAPS Take 1 tablet by mouth daily at 10 pm. 08/05/15   Roe Coombs, CNM  traMADol (ULTRAM) 50 MG tablet Take 1 tablet (50 mg total) by mouth every 6 (six) hours as needed. 09/25/15   Charm Rings, MD    Family History No family history on file.  Social History Social History  Substance Use Topics  . Smoking status: Never Smoker  . Smokeless tobacco: Never Used  . Alcohol use No  Allergies   Sulfa antibiotics and Penicillins   Review of Systems Review of Systems  Constitutional: Negative for fever and unexpected weight change.  Gastrointestinal: Negative for constipation.       Negative for fecal incontinence.   Genitourinary: Negative for dysuria, flank pain, hematuria, pelvic pain, vaginal bleeding and vaginal discharge.       Negative for urinary incontinence or retention.  Musculoskeletal: Positive for back pain.  Neurological: Negative for weakness and numbness.       Denies saddle paresthesias.     Physical Exam Updated Vital Signs BP (!) 144/77 (BP  Location: Left Arm)   Pulse 71   Temp 98.1 F (36.7 C) (Oral)   Resp 16   Ht  (1.651 m)   Wt (!) 140.6 kg   SpO2 96%   BMI 51.59 kg/m   Physical Exam  Constitutional: She appears well-developed and well-nourished.  HENT:  Head: Normocephalic and atraumatic.  Eyes: Conjunctivae are normal.  Neck: Normal range of motion. Neck supple.  Pulmonary/Chest: Effort normal.  Abdominal: Soft. There is no tenderness. There is no CVA tenderness.  Musculoskeletal: Normal range of motion.       Cervical back: She exhibits normal range of motion and no tenderness.       Thoracic back: She exhibits normal range of motion, no tenderness and no bony tenderness.       Lumbar back: She exhibits tenderness (Bilateral paraspinous). She exhibits normal range of motion and no bony tenderness.  No step-off noted with palpation of spine.   Neurological: She is alert. She has normal strength and normal reflexes. No sensory deficit.  5/5 strength in entire lower extremities bilaterally. No sensation deficit.   Skin: Skin is warm and dry. No rash noted.  Psychiatric: She has a normal mood and affect.  Nursing note and vitals reviewed.    ED Treatments / Results  Labs (all labs ordered are listed, but only abnormal results are displayed) Labs Reviewed - No data to display  EKG  EKG Interpretation None       Radiology Dg Lumbar Spine Complete  Result Date: 03/15/2017 CLINICAL DATA:  Low back pain radiating into the lower extremities, onset yesterday. No trauma. EXAM: LUMBAR SPINE - COMPLETE 4+ VIEW COMPARISON:  None. FINDINGS: The lumbar vertebrae are normal in height. No spondylolysis. No spondylolisthesis. Mild degenerative lumbar disc changes at L3-4. Facet articulations are well preserved bilaterally. Sacroiliac joints are unremarkable. IMPRESSION: Mild degenerative lumbar disc changes at L3-4. Electronically Signed   By: Ellery Plunk M.D.   On: 03/15/2017 04:56     Procedures Procedures (including critical care time)  Medications Ordered in ED Medications  oxyCODONE-acetaminophen (PERCOCET/ROXICET) 5-325 MG per tablet (not administered)  oxyCODONE-acetaminophen (PERCOCET/ROXICET) 5-325 MG per tablet 1 tablet (1 tablet Oral Given 03/14/17 2237)  ketorolac (TORADOL) 30 MG/ML injection 30 mg (30 mg Intravenous Given 03/15/17 0457)  HYDROmorphone (DILAUDID) injection 0.5 mg (0.5 mg Intravenous Given 03/15/17 0457)  ondansetron (ZOFRAN) injection 4 mg (4 mg Intravenous Given 03/15/17 0457)  methocarbamol (ROBAXIN) tablet 500 mg (500 mg Oral Given 03/15/17 0449)     Initial Impression / Assessment and Plan / ED Course  I have reviewed the triage vital signs and the nursing notes.  Pertinent labs & imaging results that were available during my care of the patient were reviewed by me and considered in my medical decision making (see chart for details).     4:27 AM Patient seen and examined. Work-up initiated. Medications  ordered. No red flags at this time, do not feet that emergent MRI indicated given history and exam. Only atypical feature is pain into both legs. Given that she does not have history of chronic back issues, will obtain lumbar films while pain control undertaken.   Vital signs reviewed and are as follows: Vitals:   03/14/17 2232 03/15/17 0349  BP: 133/67 (!) 144/77  Pulse: 67 71  Resp: 16 16  Temp: 98.1 F (36.7 C)    5:44 AM Patient updated on results. She states she is feeling a bit better. Will d/c after she ambulates.   6:15 AM Patient has ambulated slowly. She states that she feels like her right leg will give out. Her pain is 7 out of 10. We discussed discharge versus continued symptom management. She does have friends who can help her out home. She would like additional medications at this time prior to discharge. Patient reexamined and exam is stable.  Sign out to [employer] at shift change. Additional dose of Dilaudid ordered.  Will also treat with course of steroids given radicular nature.  Patient will need to be reevaluated and I suspect she will go home with symptom management. If for some reason she cannot walk or symptoms worsen, consider MRI of lumbar spine.  Rx for naproxen, tramadol, prednisone, Robaxin.  Patient counseled on use of narcotic pain medications and muscle relaxer. Counseled not to combine these medications with others containing tylenol. Urged not to drink alcohol, drive, or perform any other activities that requires focus while taking these medications. The patient verbalizes understanding and agrees with the plan.  Patient urged to follow-up with PCP if pain does not improve with treatment and rest or if pain becomes recurrent. Urged to return with worsening severe pain, loss of bowel or bladder control, trouble walking.      Final Clinical Impressions(s) / ED Diagnoses   Final diagnoses:  Acute bilateral low back pain with bilateral sciatica   Patient with back pain With radicular features. Radiculopathy is worse on the right.. No neurological deficits other than trace right lower extremity weakness. Patient is ambulatory. No warning symptoms of back pain including: fecal incontinence, urinary retention or overflow incontinence, night sweats, waking from sleep with back pain, unexplained fevers or weight loss, h/o cancer, IVDU, recent trauma. No concern for cauda equina, epidural abscess, or other serious cause of back pain. Conservative measures such as rest, ice/heat and pain medicine indicated with PCP follow-up if no improvement with conservative management.     New Prescriptions New Prescriptions   METHOCARBAMOL (ROBAXIN) 500 MG TABLET    Take 2 tablets (1,000 mg total) by mouth 4 (four) times daily.   NAPROXEN (NAPROSYN) 500 MG TABLET    Take 1 tablet (500 mg total) by mouth 2 (two) times daily.   PREDNISONE (DELTASONE) 20 MG TABLET    3 Tabs PO Days 1-3, then 2 tabs PO Days 4-6,  then 1 tab PO Day 7-9, then Half Tab PO Day 10-12   TRAMADOL (ULTRAM) 50 MG TABLET    Take 1 tablet (50 mg total) by mouth every 6 (six) hours as needed.     Renne Crigler, PA-C 03/15/17 0701    Layla Maw Ward, DO 03/15/17 [phone]

## 2017-03-15 NOTE — Discharge Instructions (Signed)
Please read and follow all provided instructions.  Your diagnoses today include:  1. Acute bilateral low back pain with bilateral sciatica     Tests performed today include:  Vital signs - see below for your results today  Lower back x-ray - shows mild narrowing between L3-L4  Medications prescribed:   Robaxin (methocarbamol) - muscle relaxer medication  DO NOT drive or perform any activities that require you to be awake and alert because this medicine can make you drowsy.    Tramadol - narcotic-like pain medication  DO NOT drive or perform any activities that require you to be awake and alert because this medicine can make you drowsy.    Naproxen - anti-inflammatory pain medication  Do not exceed  naproxen every 12 hours, take with food  You have been prescribed an anti-inflammatory medication or NSAID. Take with food. Take smallest effective dose for the shortest duration needed for your pain. Stop taking if you experience stomach pain or vomiting.   Take any prescribed medications only as directed.  Home care instructions:   Follow any educational materials contained in this packet  Please rest, use ice or heat on your back for the next several days  Do not lift, push, pull anything more than 10 pounds for the next week  Follow-up instructions: Please follow-up with your primary care provider in the next 5 days for further evaluation of your symptoms.   Return instructions:  SEEK IMMEDIATE MEDICAL ATTENTION IF YOU HAVE:  New numbness, tingling, weakness, or problem with the use of your arms or legs  Severe back pain not relieved with medications  Loss control of your bowels or bladder  Increasing pain in any areas of the body (such as chest or abdominal pain)  Shortness of breath, dizziness, or fainting.   Worsening nausea (feeling sick to your stomach), vomiting, fever, or sweats  Any other emergent concerns regarding your health   Additional  Information:  Your vital signs today were: BP (!) 144/77 (BP Location: Left Arm)    Pulse 71    Temp 98.1 F (36.7 C) (Oral)    Resp 16    Ht  (1.651 m)    Wt (!) 140.6 kg    SpO2 96%    BMI 51.59 kg/m  If your blood pressure (BP) was elevated above 135/85 this visit, please have this repeated by your doctor within one month. --------------

## 2017-03-15 NOTE — ED Notes (Signed)
Pt ambulated in hall. Pt complained of pain score being 7 out of 10. Pt demonstrated difficulty to get out of bed and walk stating her leg was also feeling weak. Moderate assistance needed to get out of bed. Standby assist needed while ambulating.

## 2017-03-15 NOTE — ED Notes (Signed)
Pt in triage  

## 2017-03-15 NOTE — ED Notes (Signed)
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 States the pain is currently 7/10 . Denies numbness and tingling to lower ext. At present.;

## 2017-03-15 NOTE — ED Provider Notes (Signed)
Patient signed out to me by Maxie Better PA-C. She is a 46 year old female presents with acute onset of back pain. No red flags. Will plan for pain control and d/c to PCP.  On recheck, pt reports being more comfortable. She was ambulated by nursing staff again and needed minimal assistance. Will d/c.   Bethel Born, PA-C 03/15/17 0848    Layla Maw Ward, DO 03/15/17 2314

## 2017-09-29 ENCOUNTER — Ambulatory Visit (INDEPENDENT_AMBULATORY_CARE_PROVIDER_SITE_OTHER): Payer: Self-pay | Admitting: Orthopedic Surgery

## 2017-10-11 ENCOUNTER — Ambulatory Visit (INDEPENDENT_AMBULATORY_CARE_PROVIDER_SITE_OTHER): Payer: BC Managed Care – PPO

## 2017-10-11 ENCOUNTER — Ambulatory Visit (INDEPENDENT_AMBULATORY_CARE_PROVIDER_SITE_OTHER): Payer: BC Managed Care – PPO | Admitting: Orthopedic Surgery

## 2017-10-11 ENCOUNTER — Encounter (INDEPENDENT_AMBULATORY_CARE_PROVIDER_SITE_OTHER): Payer: Self-pay | Admitting: Orthopedic Surgery

## 2017-10-11 DIAGNOSIS — M5441 Lumbago with sciatica, right side: Secondary | ICD-10-CM

## 2017-10-11 DIAGNOSIS — M542 Cervicalgia: Secondary | ICD-10-CM | POA: Diagnosis not present

## 2017-10-11 DIAGNOSIS — M5442 Lumbago with sciatica, left side: Secondary | ICD-10-CM | POA: Diagnosis not present

## 2017-10-11 MED ORDER — PREDNISONE 10 MG PO TABS: 20.0000 mg | ORAL_TABLET | Freq: Every day | ORAL | 0 refills | Status: DC
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
## 2017-11-21 ENCOUNTER — Other Ambulatory Visit (INDEPENDENT_AMBULATORY_CARE_PROVIDER_SITE_OTHER): Payer: Self-pay | Admitting: Orthopedic Surgery

## 2017-11-22 NOTE — Telephone Encounter (Signed)
Rx request 

## 2018-04-05 ENCOUNTER — Encounter: Payer: Self-pay | Admitting: Obstetrics & Gynecology

## 2018-04-05 ENCOUNTER — Ambulatory Visit (INDEPENDENT_AMBULATORY_CARE_PROVIDER_SITE_OTHER): Payer: BC Managed Care – PPO | Admitting: Obstetrics & Gynecology

## 2018-04-05 VITALS — BP 138/84 | HR 69 | Temp 98.5°F | Resp 16 | Wt 357.7 lb

## 2018-04-05 DIAGNOSIS — Z01419 Encounter for gynecological examination (general) (routine) without abnormal findings: Secondary | ICD-10-CM | POA: Diagnosis not present

## 2018-04-05 DIAGNOSIS — Z1231 Encounter for screening mammogram for malignant neoplasm of breast: Secondary | ICD-10-CM

## 2018-04-05 DIAGNOSIS — Z124 Encounter for screening for malignant neoplasm of cervix: Secondary | ICD-10-CM

## 2018-04-05 DIAGNOSIS — A599 Trichomoniasis, unspecified: Secondary | ICD-10-CM

## 2018-04-05 DIAGNOSIS — Z1239 Encounter for other screening for malignant neoplasm of breast: Secondary | ICD-10-CM

## 2018-04-05 DIAGNOSIS — Z1151 Encounter for screening for human papillomavirus (HPV): Secondary | ICD-10-CM

## 2018-04-05 NOTE — Progress Notes (Signed)
Subjective:     April Ortega is a 47 y.o. female here for a routine exam. [id number] with SVDx 3. LMP >4 years ago. Has a Mirena IUD. Pt has had 1 very light cycle while on Mirena.  Current complaints: none. She was interested in discussing permanent sterilization. She has a     Gynecologic History No LMP recorded. (Menstrual status: IUD). Contraception: IUD Last Pap: maybe 1 year prev Results were: normal Last mammogram: 2014. Results were: normal  The following portions of the patient's history were reviewed and updated as appropriate: allergies, current medications, past family history, past medical history, past social history, past surgical history and problem list.  Review of Systems Pertinent items are noted in HPI.    Objective:  BP 138/84 (BP Location: Left Arm, Patient Position: Sitting, Cuff Size: Large)   Pulse 69   Temp 98.5 F (36.9 C) (Oral)   Resp 16   Wt (!) 357 lb 11.2 oz (162.3 kg)   BMI 59.52 kg/m   General Appearance:    Alert, cooperative, no distress, appears stated age  Head:    Normocephalic, without obvious abnormality, atraumatic  Eyes:    conjunctiva/corneas clear, EOM's intact, both eyes  Ears:    Normal external ear canals, both ears  Nose:   Nares normal, septum midline, mucosa normal, no drainage    or sinus tenderness  Throat:   Lips, mucosa, and tongue normal; teeth and gums normal  Neck:   Supple, symmetrical, trachea midline, no adenopathy;    thyroid:  no enlargement/tenderness/nodules  Back:     Symmetric, no curvature, ROM normal, no CVA tenderness  Lungs:     Clear to auscultation bilaterally, respirations unlabored  Chest Wall:    No tenderness or deformity   Heart:    Regular rate and rhythm, S1 and S2 normal, no murmur, rub   or gallop  Breast Exam:    No tenderness, masses, or nipple abnormality  Abdomen:     Obese, Soft, non-tender, bowel sounds active all four quadrants,    no masses, no organomegaly  Genitalia:    Normal female  without lesion, discharge or tenderness     Extremities:   Extremities normal, atraumatic, no cyanosis or edema  Pulses:   2+ and symmetric all extremities  Skin:   Skin color, texture, turgor normal, no rashes or lesions     Assessment:    Healthy female exam.   Contraception counseling- after reviewin options and benefits of the LnIUD for control fo pts prev AUB, Pt will cont with the LnIUD.   Breast cancer screen    Plan:   F/u Dec for IUD removal and reinsertion  Mammogram F/u PAP with hrHPV  F/u in 1 year for annual  Celise Bazar L. Harraway-Smith, M.D., Evern CoreFACOG

## 2018-04-06 LAB — CYTOLOGY - PAP
DIAGNOSIS: NEGATIVE
HPV (WINDOPATH): NOT DETECTED

## 2018-04-10 ENCOUNTER — Ambulatory Visit: Payer: BC Managed Care – PPO | Admitting: Obstetrics and Gynecology

## 2018-04-10 MED ORDER — METRONIDAZOLE 500 MG PO TABS: ORAL_TABLET | ORAL | 0 refills | Status: DC

## 2018-04-10 NOTE — Addendum Note (Signed)
Addended by: Willodean Rosenthal on: 04/10/2018 09:35 AM   Modules accepted: Orders

## 2018-04-25 ENCOUNTER — Ambulatory Visit: Payer: BC Managed Care – PPO

## 2018-05-15 ENCOUNTER — Ambulatory Visit: Payer: BC Managed Care – PPO

## 2018-09-13 ENCOUNTER — Ambulatory Visit: Payer: Self-pay

## 2018-09-13 ENCOUNTER — Other Ambulatory Visit: Payer: Self-pay | Admitting: Occupational Medicine

## 2018-09-13 DIAGNOSIS — M25562 Pain in left knee: Secondary | ICD-10-CM

## 2018-11-13 ENCOUNTER — Encounter: Payer: Self-pay | Admitting: Obstetrics and Gynecology

## 2018-11-13 ENCOUNTER — Ambulatory Visit: Payer: BC Managed Care – PPO | Admitting: Obstetrics and Gynecology

## 2018-11-13 VITALS — BP 142/93 | HR 76 | Ht 65.0 in | Wt 362.7 lb

## 2018-11-13 DIAGNOSIS — Z30433 Encounter for removal and reinsertion of intrauterine contraceptive device: Secondary | ICD-10-CM | POA: Diagnosis not present

## 2018-11-13 DIAGNOSIS — Z3202 Encounter for pregnancy test, result negative: Secondary | ICD-10-CM | POA: Diagnosis not present

## 2018-11-13 DIAGNOSIS — Z30430 Encounter for insertion of intrauterine contraceptive device: Secondary | ICD-10-CM | POA: Diagnosis not present

## 2018-11-13 LAB — POCT URINE PREGNANCY: Preg Test, Ur: NEGATIVE

## 2018-11-13 MED ORDER — LEVONORGESTREL 20 MCG/24HR IU IUD
INTRAUTERINE_SYSTEM | Freq: Once | INTRAUTERINE | Status: AC
  Administered 2018-11-13: 12:00:00 via INTRAUTERINE

## 2018-11-13 NOTE — Progress Notes (Signed)
Presents for IUD removal and reinsert, but she prefers to have Tubulization.  BP elevated, she did not take Rx today.

## 2018-11-13 NOTE — Addendum Note (Signed)
Addended by: Frutoso ChaseOX, Whitley Strycharz on: 11/13/2018 11:45 AM   Modules accepted: Orders

## 2018-11-13 NOTE — Progress Notes (Signed)
47 yo here for IUD removal and reinsertion. Patient previously expressed the desire for permanent sterilization. Reviewed all contraception options with the patient including the benefits of cycle control with Mirena IUD. Patient was not aware that her period would resume following BTL. Patient opted to continue with Mirena IUD  GYNECOLOGY CLINIC PROCEDURE NOTE  April GoesJesta Ortega is a 47 y.o. No obstetric history on file. here for IUD removal. No GYN concerns.  Last pap smear was on 03/2018 and was normal.  IUD Removal  Patient identified, informed consent performed, consent signed.  Patient was in the dorsal lithotomy position, normal external genitalia was noted.  A speculum was placed in the patient's vagina, normal discharge was noted, no lesions. The cervix was visualized, no lesions, no abnormal discharge.  The strings of the IUD were grasped and pulled using ring forceps. The IUD was removed in its entirety.  Patient tolerated the procedure well.    IUD Insertion  Speculum remained in the vagina.  Cervix visualized.  Cleaned with Betadine x 2.  Grasped anteriorly with a single tooth tenaculum.  Uterus sounded to 8 cm.  Mirena IUD placed per manufacturer's recommendations.  Strings trimmed to 3 cm. Tenaculum was removed, good hemostasis noted.  Patient tolerated procedure well.   Patient given post procedure instructions and Mirena care card with expiration date.  Patient is asked to check IUD strings periodically and follow up in 4-6 weeks for IUD check.

## 2018-12-12 ENCOUNTER — Ambulatory Visit: Payer: BC Managed Care – PPO | Admitting: Obstetrics and Gynecology

## 2018-12-25 ENCOUNTER — Ambulatory Visit: Payer: BC Managed Care – PPO | Admitting: Obstetrics and Gynecology

## 2019-01-01 ENCOUNTER — Ambulatory Visit (INDEPENDENT_AMBULATORY_CARE_PROVIDER_SITE_OTHER): Payer: BC Managed Care – PPO | Admitting: Obstetrics and Gynecology

## 2019-01-01 ENCOUNTER — Encounter: Payer: Self-pay | Admitting: Obstetrics and Gynecology

## 2019-01-01 VITALS — BP 134/86 | HR 82 | Wt 356.9 lb

## 2019-01-01 DIAGNOSIS — R110 Nausea: Secondary | ICD-10-CM | POA: Diagnosis not present

## 2019-01-01 DIAGNOSIS — Z30431 Encounter for routine checking of intrauterine contraceptive device: Secondary | ICD-10-CM

## 2019-01-01 LAB — POCT URINE PREGNANCY: Preg Test, Ur: NEGATIVE

## 2019-01-01 NOTE — Progress Notes (Signed)
Pt is here for IUD check, Mirena placed 11/13/18.

## 2019-01-01 NOTE — Progress Notes (Signed)
48 yo here for IUD check. Patient had IUD placed on 11/13/2018. She denies any irregular vaginal bleeding or pelvic pain. She denies pain with intercourse  Past Medical History:  Diagnosis Date  . Asthma   . H/O seasonal allergies   . Hypertension   . Hypothyroid   . Sciatica    No past surgical history on file. No family history on file. Social History   Tobacco Use  . Smoking status: Never Smoker  . Smokeless tobacco: Never Used  Substance Use Topics  . Alcohol use: No  . Drug use: No   ROS See pertinent in HPI  Blood pressure 134/86, pulse 82, weight (!) 356 lb 14.4 oz (161.9 kg). GENERAL: Well-developed, well-nourished female in no acute distress.  ABDOMEN: Soft, nontender, nondistended. No organomegaly. PELVIC: Normal external female genitalia. Vagina is pink and rugated.  Normal discharge. Normal appearing cervix with IUD strings visualized at the os EXTREMITIES: No cyanosis, clubbing, or edema, 2+ distal pulses.  A/P 48 yo here for IUD check - IUD appears to be in the appropriate location - RTC prn

## 2019-05-09 ENCOUNTER — Encounter (HOSPITAL_COMMUNITY): Payer: Self-pay

## 2019-05-09 ENCOUNTER — Other Ambulatory Visit: Payer: Self-pay

## 2019-05-09 ENCOUNTER — Ambulatory Visit (HOSPITAL_COMMUNITY)
Admission: EM | Admit: 2019-05-09 | Discharge: 2019-05-09 | Disposition: A | Discharge status: Home / Self Care | Payer: BC Managed Care – PPO | Attending: Internal Medicine | Admitting: Internal Medicine

## 2019-05-09 DIAGNOSIS — S91114A Laceration without foreign body of right lesser toe(s) without damage to nail, initial encounter: Secondary | ICD-10-CM | POA: Diagnosis not present

## 2019-05-09 DIAGNOSIS — Z23 Encounter for immunization: Secondary | ICD-10-CM

## 2019-05-09 DIAGNOSIS — W109XXA Fall (on) (from) unspecified stairs and steps, initial encounter: Secondary | ICD-10-CM

## 2019-05-09 MED ORDER — TETANUS-DIPHTH-ACELL PERTUSSIS 5-2.5-18.5 LF-MCG/0.5 IM SUSP
0.5000 mL | Freq: Once | INTRAMUSCULAR | Status: AC
  Administered 2019-05-09: 21:00:00 0.5 mL via INTRAMUSCULAR

## 2019-05-09 MED ORDER — TETANUS-DIPHTH-ACELL PERTUSSIS 5-2.5-18.5 LF-MCG/0.5 IM SUSP
INTRAMUSCULAR | Status: AC
  Filled 2019-05-09: qty 0.5

## 2019-05-09 NOTE — ED Provider Notes (Signed)
[facility]    CSN: [id number] Arrival date & time: 05/09/19  1924     History   Chief Complaint Chief Complaint  Patient presents with   Extremity Laceration    HPI April Ortega is a 48 y.o. female presenting for acute concern of right fifth digit laceration.  Patient states that she slipped on a concrete step and was unaware that she had a laceration until family member told her.  Wound was irrigated at home by family friend who is a LawyerCNA.  Patient endorses pain, swelling.  No other areas of injury, LOC.  Patient denies history of HIV, hep C, diabetes, immune disorders.  Is not currently on anticoagulation or aspirin.  Was controlled with direct pressure.  Patient unsure of last tetanus booster.    Past Medical History:  Diagnosis Date   Asthma    H/O seasonal allergies    Hypertension    Hypothyroid    Sciatica     Patient Active Problem List   Diagnosis Date Noted   Morbid obesity (HCC) 08/05/2015    History reviewed. No pertinent surgical history.  OB History   No obstetric history on file.      Home Medications    Prior to Admission medications   Medication Sig Start Date End Date Taking? Authorizing Provider  amLODipine (NORVASC) 5 MG tablet Take 1 tablet by mouth daily. 01/05/18   [provider]  Biotin 5000 MCG CAPS Take 5,000 mcg by mouth daily.    [provider]  [facility] D3 5000 units capsule Take 1 capsule by mouth 3 (three) times a week. 01/05/18   [provider]  ibuprofen (ADVIL,MOTRIN) 200 MG tablet Take 200 mg by mouth every 6 (six) hours as needed for moderate pain.    [provider]  levothyroxine (SYNTHROID, LEVOTHROID) 50 MCG tablet Take 250 mcg by mouth daily before breakfast.    [provider]  loratadine (CLARITIN) 10 MG tablet Take 10 mg by mouth daily.    [provider]  metFORMIN (GLUCOPHAGE) 500 MG tablet Take 1 tablet by mouth at bedtime. 01/05/18   [provider]  methocarbamol (ROBAXIN) 500 MG tablet Take 2 tablets (1,000 mg total) by mouth 4 (four) times daily. Patient not taking: Reported on 04/05/2018 03/15/17   Renne CriglerGeiple, Joshua, PA-C  metroNIDAZOLE (FLAGYL) 500 MG tablet Take two tablets by mouth twice a day, for one day.  Or you can take all four tablets at once if you can tolerate it. Patient not taking: Reported on 01/01/2019 04/10/18   Willodean RosenthalHarraway-Smith, Carolyn, MD  Multiple Vitamin (MULTIVITAMIN WITH MINERALS) TABS tablet Take 1 tablet by mouth daily.    [provider]  naproxen (NAPROSYN) 500 MG tablet Take 1 tablet (500 mg total) by mouth 2 (two) times daily. Patient not taking: Reported on 01/01/2019 03/15/17   Renne CriglerGeiple, Joshua, PA-C  phentermine (ADIPEX-P) 37.5 MG tablet Take 1 tablet (37.5 mg total) by mouth daily before breakfast. Patient not taking: Reported on 04/05/2018 08/05/15   Orvilla Cornwallenney, Rachelle A, CNM  topiramate (TOPAMAX) 50 MG tablet Take 50 mg by mouth 2 (two) times daily. 01/05/18   [provider]  traMADol (ULTRAM) 50 MG tablet Take 1 tablet (50 mg total) by mouth every 6 (six) hours as needed. 03/15/17   Renne CriglerGeiple, Joshua, PA-C    Family History Family History  Family history unknown: Yes    Social History Social History   Tobacco Use   Smoking status: Never Smoker  Smokeless tobacco: Never Used  Substance Use Topics   Alcohol use: No   Drug use: No     Allergies   Sulfa antibiotics and Penicillins   Review of Systems Review of Systems as per HPI   Physical Exam Triage Vital Signs ED Triage Vitals  Enc Vitals Group     BP 05/09/19 2018 133/88     Pulse Rate 05/09/19 2018 78     Resp 05/09/19 2018 18     Temp 05/09/19 2018 98.1 F (36.7 C)     Temp Source 05/09/19 2018 Oral     SpO2 05/09/19 2018 97 %     Weight --      Height --      Head Circumference --      Peak Flow --      Pain Score 05/09/19 2015 7     Pain Loc --      Pain Edu? --      Excl. in GC? --    No data  found.  Updated Vital Signs BP 133/88 (BP Location: Left Arm)    Pulse 78    Temp 98.1 F (36.7 C) (Oral)    Resp 18    SpO2 97%   Visual Acuity Right Eye Distance:   Left Eye Distance:   Bilateral Distance:    Right Eye Near:   Left Eye Near:    Bilateral Near:     Physical Exam Constitutional:      General: She is not in acute distress.    Appearance: She is obese.  Cardiovascular:     Rate and Rhythm: Normal rate.  Pulmonary:     Effort: Pulmonary effort is normal.  Musculoskeletal:     Comments: Deep laceration to fifth digit, plantar aspect.  Pneumostasis achieved with direct pressure.  Mildly tender to palpation.  No underlying erythema, streaking, warmth, purulent discharge.  No foreign bodies identified.  Neurological:     Mental Status: She is alert.      UC Treatments / Results  Labs (all labs ordered are listed, but only abnormal results are displayed) Labs Reviewed - No data to display  EKG None  Radiology No results found.  Procedures Laceration Repair Date/Time: 05/09/2019 9:06 PM Performed by: Shea Evans, PA-C Authorized by: Merrilee Jansky, MD   Consent:    Consent obtained:  Verbal   Consent given by:  Patient   Risks discussed:  Infection, need for additional repair, pain, poor cosmetic result and poor wound healing   Alternatives discussed:  No treatment and delayed treatment Universal protocol:    Patient identity confirmed:  Verbally with patient Anesthesia (see MAR for exact dosages):    Anesthesia method:  Local infiltration   Local anesthetic:  Lidocaine 2% w/o epi Laceration details:    Location:  Toe   Toe location:  R little toe   Length (cm):  1   Depth (mm):  5 Repair type:    Repair type:  Simple Pre-procedure details:    Preparation:  Patient was prepped and draped in usual sterile fashion Exploration:    Hemostasis achieved with:  Direct pressure   Wound exploration: wound explored through full range of  motion     Wound extent: no foreign bodies/material noted     Contaminated: no   Treatment:    Area cleansed with:  Hibiclens   Amount of cleaning:  Standard Skin repair:    Repair method:  Sutures   Suture size:  4-0   Suture material:  Prolene   Suture technique:  Simple interrupted   Number of sutures:  2 Approximation:    Approximation:  Close Post-procedure details:    Dressing:  Non-adherent dressing   Patient tolerance of procedure:  Tolerated well, no immediate complications   (including critical care time)  Medications Ordered in UC Medications  Tdap (BOOSTRIX) injection 0.5 mL (0.5 mLs Intramuscular Given 05/09/19 2058)    Initial Impression / Assessment and Plan / UC Course  I have reviewed the triage vital signs and the nursing notes.  Pertinent labs & imaging results that were available during my care of the patient were reviewed by me and considered in my medical decision making (see chart for details).     Pleasant 48 year old female with morbid obesity presenting for right fifth toe laceration.  Tetanus booster given in office, bleeding controlled direct pressure.  Due to patient's habitus, 2 simple interrupted sutures placed in move of horizontal mattress with successful approximation.  Patient tolerated procedure well, instructed to keep area clean and dry.  Will return in 2 weeks for suture removal. Final Clinical Impressions(s) / UC Diagnoses   Final diagnoses:  Laceration of fifth toe of right foot, initial encounter     Discharge Instructions     Keep the area clean and dry. Return if redness, pain, swelling worsens or you have prolonged bleeding. Return to clinic in 14 days for suture removal.    ED Prescriptions    None     Controlled Substance Prescriptions [place] Controlled Substance Registry consulted? Not Applicable   Shea Evans, Cordelia Poche 05/09/19 2111

## 2019-05-09 NOTE — Discharge Instructions (Signed)
Keep the area clean and dry. Return if redness, pain, swelling worsens or you have prolonged bleeding. Return to clinic in 14 days for suture removal.

## 2019-05-09 NOTE — ED Triage Notes (Signed)
Patient presents to Urgent Care with complaints of laceration under right pinky toe since falling earlier today.

## 2019-05-23 ENCOUNTER — Ambulatory Visit (HOSPITAL_COMMUNITY)
Admission: EM | Admit: 2019-05-23 | Discharge: 2019-05-23 | Disposition: A | Discharge status: Home / Self Care | Payer: BC Managed Care – PPO

## 2019-05-23 ENCOUNTER — Other Ambulatory Visit: Payer: Self-pay

## 2019-05-23 ENCOUNTER — Encounter (HOSPITAL_COMMUNITY): Payer: Self-pay | Admitting: Emergency Medicine

## 2019-05-23 DIAGNOSIS — Z4802 Encounter for removal of sutures: Secondary | ICD-10-CM | POA: Diagnosis not present

## 2019-05-23 NOTE — Discharge Instructions (Signed)
Return as needed

## 2019-05-23 NOTE — ED Triage Notes (Addendum)
Seen 05/09/2019 for laceration to right little toe  Patient is here today for suture removal

## 2019-11-27 IMAGING — DX DG KNEE COMPLETE 4+V*L*
4 series · 4 of 4 positions shown · non-contrast
Comparison: None.

CLINICAL DATA: Pain after fall

EXAM:
LEFT KNEE - COMPLETE 4+ VIEW

[knee pa]
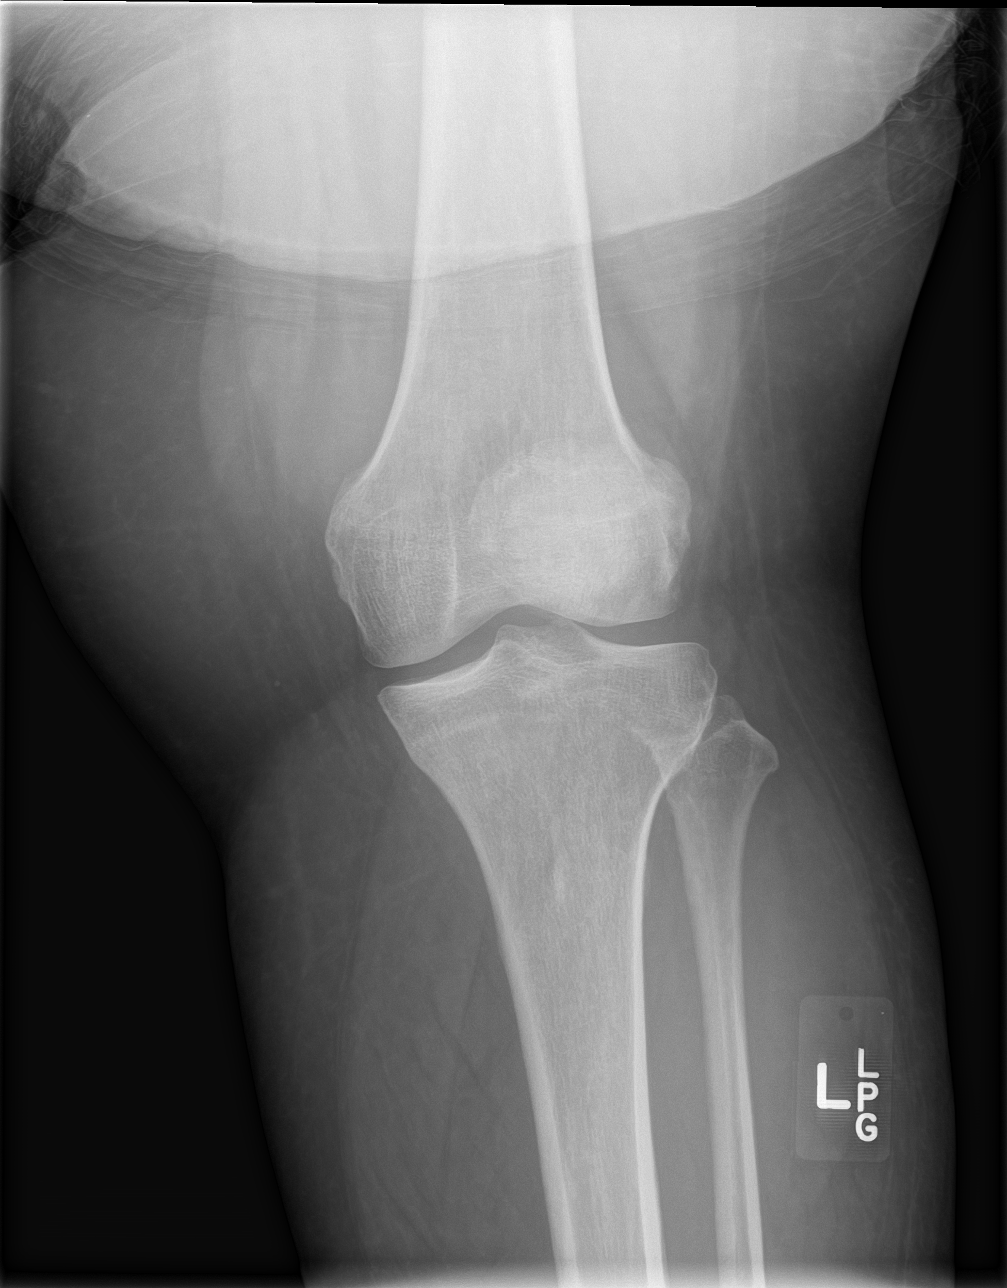

[knee obl (1 of 2)]
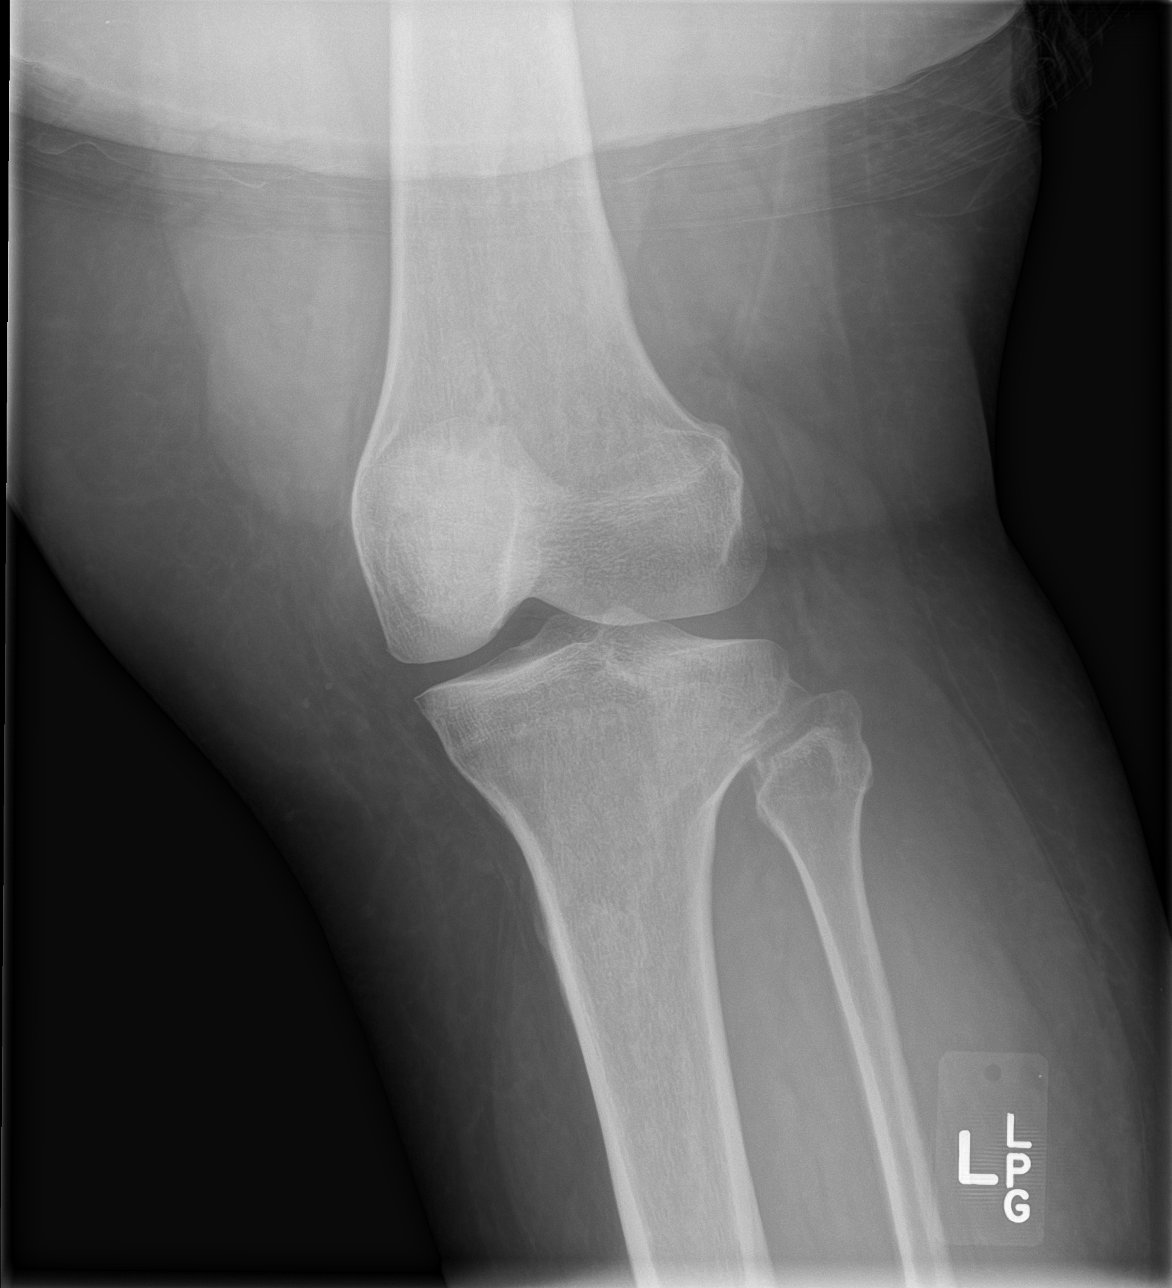

[knee obl (2 of 2)]
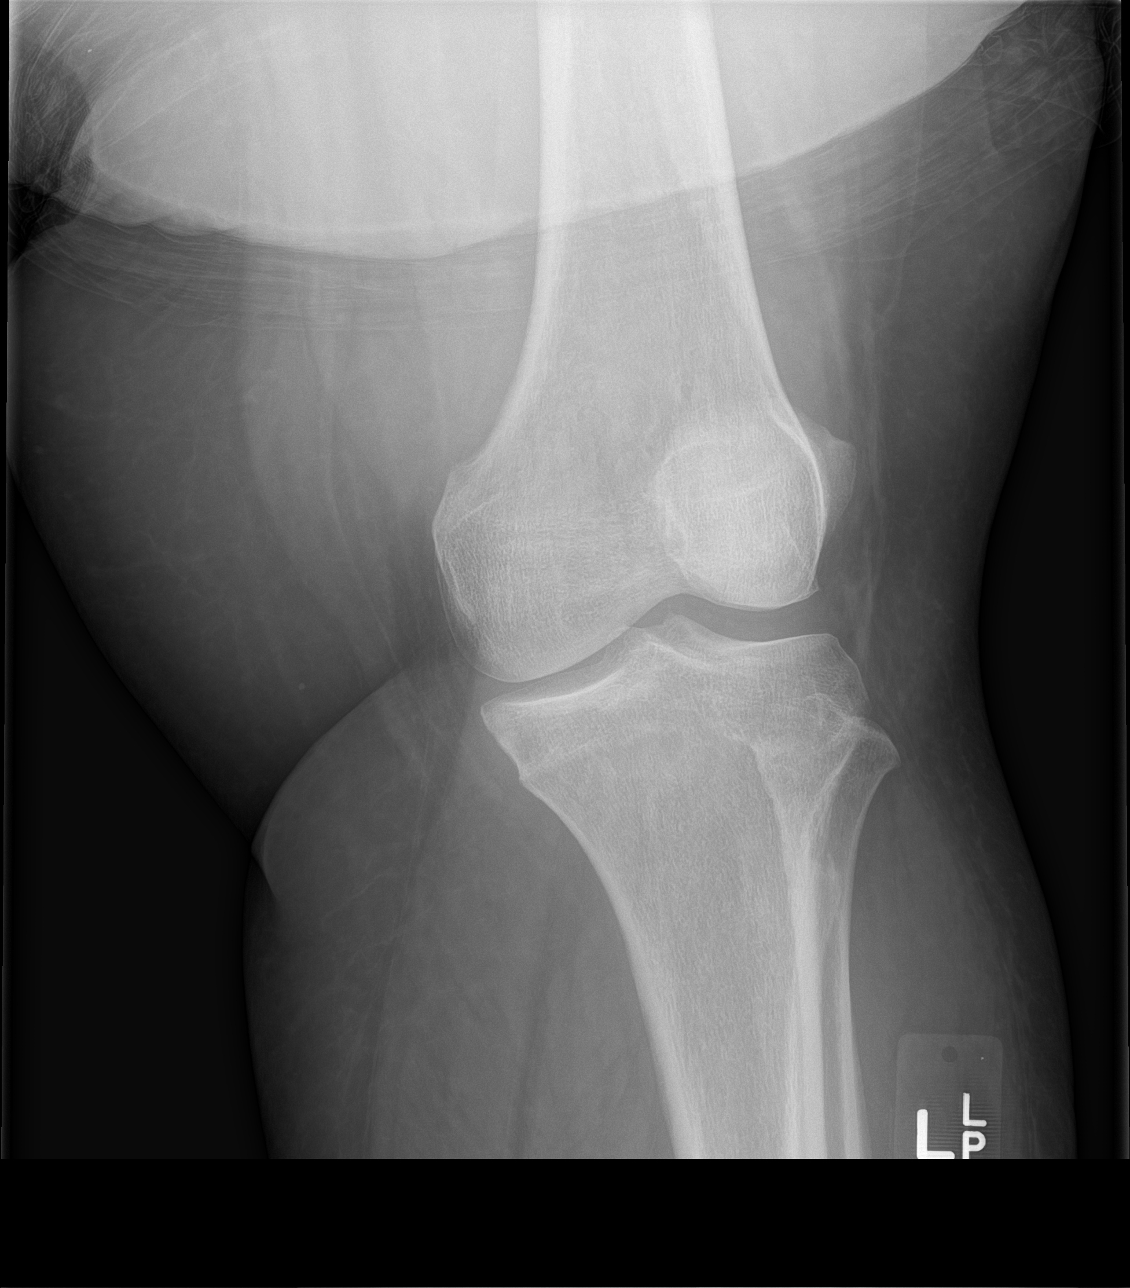

[knee lat]
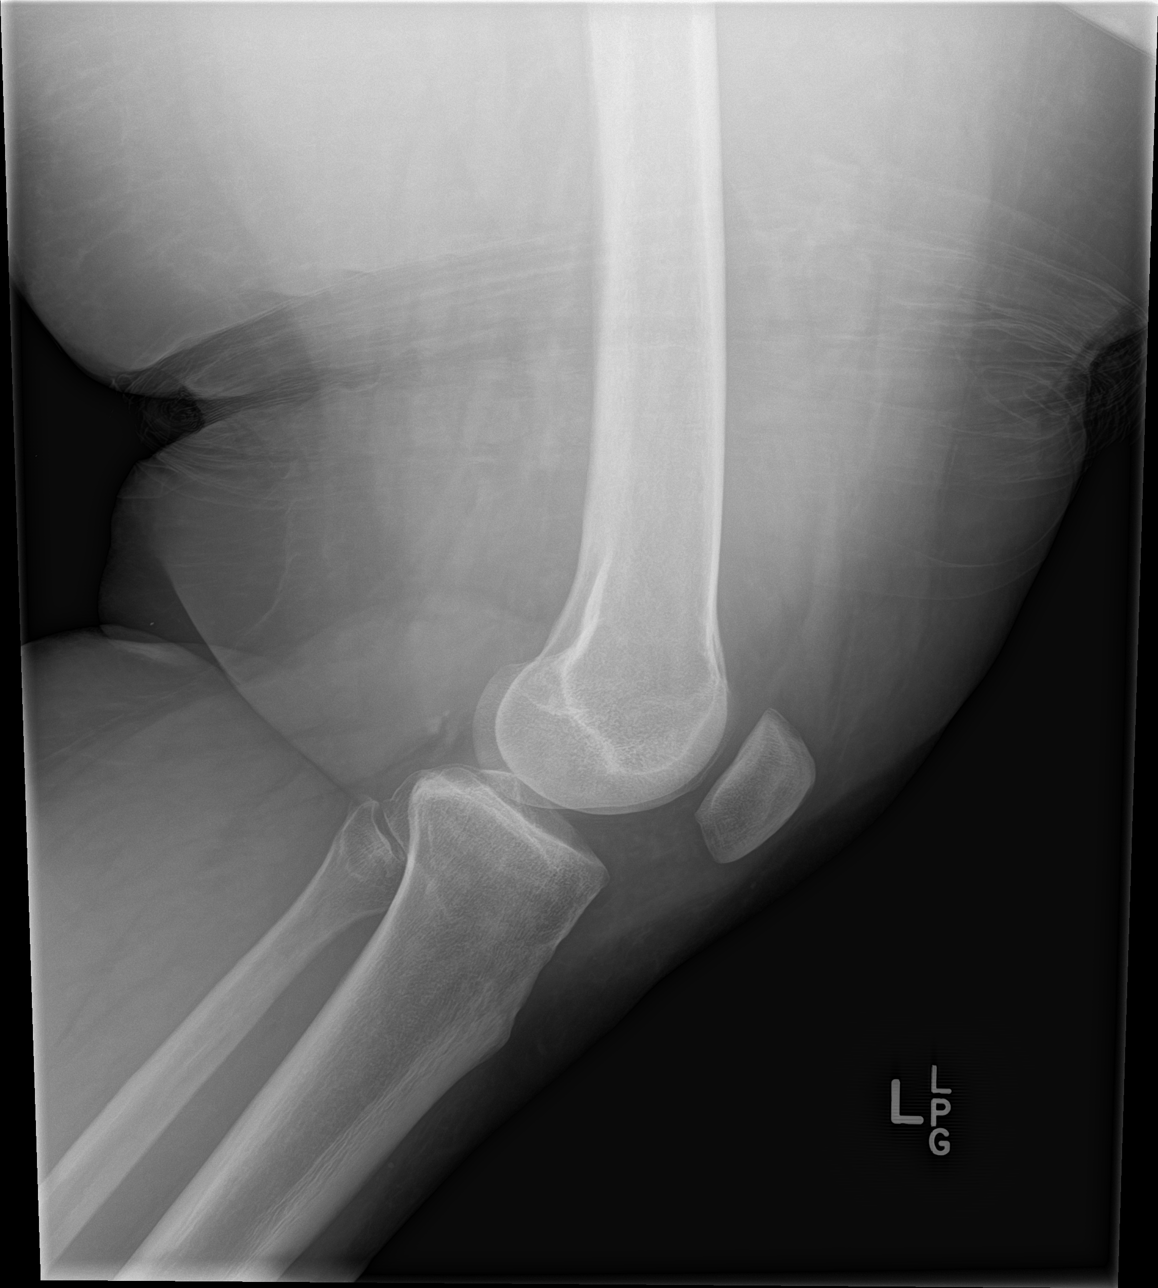

[4 of 4 positions shown; findings below may reference images not displayed]

FINDINGS: No evidence of fracture, dislocation, or joint effusion. No evidence
of arthropathy or other focal bone abnormality. Soft tissues are
unremarkable.
IMPRESSION: Negative.

## 2020-01-29 ENCOUNTER — Ambulatory Visit: Payer: Self-pay | Admitting: Obstetrics and Gynecology

## 2020-02-04 ENCOUNTER — Ambulatory Visit: Payer: Self-pay | Admitting: Obstetrics and Gynecology

## 2020-02-04 DIAGNOSIS — N898 Other specified noninflammatory disorders of vagina: Secondary | ICD-10-CM

## 2020-03-17 ENCOUNTER — Ambulatory Visit: Payer: Self-pay | Admitting: Advanced Practice Midwife

## 2021-02-12 ENCOUNTER — Other Ambulatory Visit: Payer: Self-pay | Admitting: Nurse Practitioner

## 2021-02-12 DIAGNOSIS — Z1231 Encounter for screening mammogram for malignant neoplasm of breast: Secondary | ICD-10-CM

## 2021-04-06 ENCOUNTER — Ambulatory Visit: Payer: Self-pay

## 2021-05-22 ENCOUNTER — Ambulatory Visit
Admission: RE | Admit: 2021-05-22 | Discharge: 2021-05-22 | Disposition: A | Discharge status: Home / Self Care | Payer: BC Managed Care – PPO | Source: Ambulatory Visit | Attending: Nurse Practitioner | Admitting: Nurse Practitioner

## 2021-05-22 ENCOUNTER — Other Ambulatory Visit: Payer: Self-pay

## 2021-05-22 DIAGNOSIS — Z1231 Encounter for screening mammogram for malignant neoplasm of breast: Secondary | ICD-10-CM

## 2021-10-02 ENCOUNTER — Other Ambulatory Visit: Payer: Self-pay

## 2021-10-02 ENCOUNTER — Encounter: Payer: Self-pay | Admitting: Obstetrics & Gynecology

## 2021-10-02 ENCOUNTER — Other Ambulatory Visit (HOSPITAL_COMMUNITY)
Admission: RE | Admit: 2021-10-02 | Discharge: 2021-10-02 | Disposition: A | Discharge status: Home / Self Care | Payer: BC Managed Care – PPO | Source: Ambulatory Visit | Attending: Obstetrics and Gynecology | Admitting: Obstetrics and Gynecology

## 2021-10-02 ENCOUNTER — Encounter: Payer: Self-pay | Admitting: Obstetrics and Gynecology

## 2021-10-02 ENCOUNTER — Ambulatory Visit (INDEPENDENT_AMBULATORY_CARE_PROVIDER_SITE_OTHER): Payer: BC Managed Care – PPO | Admitting: Obstetrics and Gynecology

## 2021-10-02 VITALS — BP 167/91 | HR 84 | Ht 65.0 in | Wt 385.0 lb

## 2021-10-02 DIAGNOSIS — Z01419 Encounter for gynecological examination (general) (routine) without abnormal findings: Secondary | ICD-10-CM

## 2021-10-02 DIAGNOSIS — Z113 Encounter for screening for infections with a predominantly sexual mode of transmission: Secondary | ICD-10-CM | POA: Insufficient documentation

## 2021-10-02 NOTE — Progress Notes (Signed)
Pt presents today for yearly exam. LMP/BCM: IUD. Last pap: 03/2018 WNL MMG: 05/2021 WNL. Pt states PCP is going to get patient scheduled for colonoscopy. Pt states she has a bump on the top inside of the vagina. States it has been there for approximately 1 week. It is not painful and is not draining anything.

## 2021-10-02 NOTE — Progress Notes (Signed)
   WELL-WOMAN PHYSICAL & PAP Patient name: April Ortega MRN [id number]  Date of birth: 08-05-1971 Chief Complaint:   Gynecologic Exam  History of Present Illness:   April Ortega is a 50 y.o. [phone] African American female being seen today for a routine well-woman exam.  Current complaints: she felt a "bump" above her clitoral area, no complaints of pain or discharge from that area. Requesting STI testing.  PCP: Quitman Livings, MD      does desire labs No LMP recorded (exact date). (Menstrual status: IUD). The current method of family planning is IUD.  Last pap 04/05/2018. Results were: normal Last mammogram: 05/22/2021. Results were: normal. Family h/o breast cancer: No Last colonoscopy: never had - PCP is making referral. Family h/o colorectal cancer: No Review of Systems:   Pertinent items are noted in HPI Denies any headaches, blurred vision, fatigue, shortness of breath, chest pain, abdominal pain, abnormal vaginal discharge/itching/odor/irritation, problems with periods, bowel movements, urination, or intercourse unless otherwise stated above. Pertinent History Reviewed:  Reviewed past medical,surgical, social and family history.  Reviewed problem list, medications and allergies. Physical Assessment:   Vitals:   10/02/21 0934  BP: (!) 167/91  Pulse: 84  Weight: (!) 385 lb (174.6 kg)  Height: 5\' 5"  (1.651 m)  Body mass index is 64.07 kg/m.        Physical Examination:   General appearance - well appearing, and in no distress  Mental status - alert, oriented to person, place, and time  Psych:  She has a normal mood and affect  Skin - warm and dry, normal color, no suspicious lesions noted  Chest - effort normal, all lung fields clear to auscultation bilaterally  Heart - normal rate and regular rhythm  Neck:  midline trachea, no thyromegaly or nodules  Breasts - breasts appear normal, no suspicious masses, no skin or nipple changes or  axillary nodes  Abdomen - soft,  nontender, nondistended, no masses or organomegaly  Pelvic - VULVA: normal appearing vulva with no masses, tenderness or lesions -- no bump seen above or near clitoral area  VAGINA: normal appearing vagina with normal color and  malodorous discharge, no lesions  CERVIX: normal appearing cervix without discharge or lesions, no CMT  Thin prep pap is done with HR HPV cotesting  UTERUS: uterus is felt to be normal size, shape, consistency and nontender   ADNEXA: No adnexal masses or tenderness noted.  Rectal - deferred  Extremities:  No swelling or varicosities noted  No results found for this or any previous visit (from the past 24 hour(s)).  Assessment & Plan:  1) Well-Woman Exam with Pap  2) Encounter for well woman exam with routine gynecological exam  3) Screen for sexually transmitted diseases  - Will treat when resulted - All results will appear in MyChart  Labs/procedures today: pap, STI testing  Mammogram in 1 year or sooner if problems Colonoscopy ASAP or sooner if problems -- PCP is making referral per patient  No orders of the defined types were placed in this encounter.   Meds: No orders of the defined types were placed in this encounter.   Follow-up: Return in about 1 year (around 10/02/2022) for Annual Exam.  10/04/2022 MSN, CNM 10/02/2021 10:25 AM

## 2021-10-03 LAB — RPR+HBSAG+HCVAB+...
HIV Screen 4th Generation wRfx: NONREACTIVE
Hep C Virus Ab: <0.1 s/co ratio (ref 0.0–0.9)
Hepatitis B Surface Ag: NEGATIVE
RPR Ser Ql: NONREACTIVE

## 2021-10-06 ENCOUNTER — Other Ambulatory Visit: Payer: Self-pay | Admitting: Obstetrics and Gynecology

## 2021-10-06 DIAGNOSIS — B9689 Other specified bacterial agents as the cause of diseases classified elsewhere: Secondary | ICD-10-CM

## 2021-10-06 LAB — CYTOLOGY - PAP
Comment: NEGATIVE
Diagnosis: NEGATIVE
High risk HPV: NEGATIVE

## 2021-10-06 LAB — CERVICOVAGINAL ANCILLARY ONLY
Bacterial Vaginitis (gardnerella): POSITIVE — AB
Candida Glabrata: NEGATIVE
Candida Vaginitis: NEGATIVE
Chlamydia: NEGATIVE
Comment: NEGATIVE
Comment: NEGATIVE
Comment: NEGATIVE
Comment: NEGATIVE
Comment: NEGATIVE
Comment: NORMAL
Neisseria Gonorrhea: NEGATIVE
Trichomonas: NEGATIVE

## 2021-10-06 MED ORDER — METRONIDAZOLE 500 MG PO TABS: 500.0000 mg | ORAL_TABLET | Freq: Two times a day (BID) | ORAL | 0 refills | Status: DC

## 2021-10-06 NOTE — Progress Notes (Signed)
Notified via My Chart

## 2021-10-12 ENCOUNTER — Encounter: Payer: Self-pay | Admitting: Gastroenterology

## 2021-11-17 ENCOUNTER — Telehealth: Payer: Self-pay

## 2021-11-17 NOTE — Telephone Encounter (Signed)
Per Dr. Tomasa Rand, pt needs an OV prior to having procedure.  OV scheduled with [facility] on 12/22 @10 :30 am d/t BMI of 63.07

## 2021-11-17 NOTE — Telephone Encounter (Signed)
Dr. Tomasa Rand:  This patient has a BMI of 64.07.  Would you like a direct to [facility] or an OV?  Thank you.

## 2021-12-03 ENCOUNTER — Ambulatory Visit (INDEPENDENT_AMBULATORY_CARE_PROVIDER_SITE_OTHER): Payer: BC Managed Care – PPO | Admitting: Physician Assistant

## 2021-12-03 ENCOUNTER — Encounter: Payer: Self-pay | Admitting: Physician Assistant

## 2021-12-03 VITALS — BP 128/82 | HR 88 | Ht 60.0 in | Wt 393.5 lb

## 2021-12-03 DIAGNOSIS — Z1211 Encounter for screening for malignant neoplasm of colon: Secondary | ICD-10-CM

## 2021-12-03 MED ORDER — PLENVU 140 G PO SOLR
ORAL | 0 refills | Status: AC
Start: 2021-12-03 — End: ?

## 2021-12-03 NOTE — Progress Notes (Signed)
Agree with the assessment and plan as outlined by Jennifer Lemmon, PA-C. ? ?Louie Meaders E. Rocko Fesperman, MD ? ?

## 2021-12-03 NOTE — Patient Instructions (Signed)
PROCEDURES: You have been scheduled for a colonoscopy. Please follow the written instructions given to you at your visit today. If you use inhalers (even only as needed), please bring them with you on the day of your procedure.  Your Provider has sent your bowel prep to [facility]. Fast,Free delivery or shipping. Accept all major insurance benefits. Applies discounts and coupons.  What to expect: [facility] will contact you via text message to verify your information and collect your co-pay, if applicable. Be sure to click on the link in the text. Sit back and relax-your prescription will be delivered or shipped at no charge.  Have Additional questions? Call [phone]  It was great seeing you today! Thank you for entrusting me with your care and choosing [facility].  Hyacinth Meeker, PA  The [facility] providers would like to encourage you to use [facility] to communicate with providers for non-urgent requests or questions.  Due to long hold times on the telephone, sending your provider a message by [facility] may be faster and more efficient way to get a response. Please allow 48 business hours for a response.  Please remember that this is for non-urgent requests/questions.  If you are age 54 or older, your body mass index should be between 23-30. Your Body mass index is 76.85 kg/m. If this is out of the aforementioned range listed, please consider follow up with your Primary Care Provider.  If you are age 72 or younger, your body mass index should be between 19-25. Your Body mass index is 76.85 kg/m. If this is out of the aformentioned range listed, please consider follow up with your Primary Care Provider.

## 2021-12-03 NOTE — Progress Notes (Signed)
Chief Complaint: Discuss colonoscopy for family history of colon cancer  HPI:    April Ortega is a 50 year old female with a past medical history as listed below including morbid obesity, who was referred to me by Quitman Livings, MD for discussion of a colonoscopy with a family history of colon cancer.    Today, the patient presents to clinic to discuss a screening colonoscopy.  Tells me she really has no GI complaints or concerns.    Patient works as a Secondary school teacher.    Denies fever, chills, weight loss, blood in her stool, change in bowel habits, abdominal pain, heartburn or reflux.  Past Medical History:  Diagnosis Date   Asthma    H/O seasonal allergies    Hypertension    Hypothyroid    Sciatica     No past surgical history on file.  Current Outpatient Medications  Medication Sig Dispense Refill   amLODipine (NORVASC) 10 MG tablet Take 10 mg by mouth daily.     Biotin 5000 MCG CAPS Take 5,000 mcg by mouth daily.     CVS D3 5000 units capsule Take 1 capsule by mouth 3 (three) times a week.  2   diclofenac (VOLTAREN) 75 MG EC tablet Take 75 mg by mouth 2 (two) times daily.     ibuprofen (ADVIL,MOTRIN) 200 MG tablet Take 200 mg by mouth every 6 (six) hours as needed for moderate pain.     levothyroxine (SYNTHROID) 200 MCG tablet Take 300 mcg by mouth every morning.     levothyroxine (SYNTHROID) 25 MCG tablet Take 25 mcg by mouth every morning.     loratadine (CLARITIN) 10 MG tablet Take 10 mg by mouth daily.     methocarbamol (ROBAXIN) 500 MG tablet Take 2 tablets (1,000 mg total) by mouth 4 (four) times daily. 20 tablet 0   metroNIDAZOLE (FLAGYL) 500 MG tablet Take 1 tablet (500 mg total) by mouth 2 (two) times daily. 14 tablet 0   Multiple Vitamin (MULTIVITAMIN WITH MINERALS) TABS tablet Take 1 tablet by mouth daily.     No current facility-administered medications for this visit.    Allergies as of 12/03/2021 - Review Complete 10/02/2021  Allergen  Reaction Noted   Sulfa antibiotics  03/14/2017   Penicillins Rash 05/07/2012    Family History  Family history unknown: Yes    Social History   Socioeconomic History   Marital status: Widowed    Spouse name: Not on file   Number of children: Not on file   Years of education: Not on file   Highest education level: Not on file  Occupational History   Not on file  Tobacco Use   Smoking status: Never   Smokeless tobacco: Never  Vaping Use   Vaping Use: Never used  Substance and Sexual Activity   Alcohol use: No   Drug use: No   Sexual activity: Yes    Birth control/protection: I.U.D.  Other Topics Concern   Not on file  Social History Narrative   Not on file   Social Determinants of Health   Financial Resource Strain: Not on file  Food Insecurity: Not on file  Transportation Needs: Not on file  Physical Activity: Not on file  Stress: Not on file  Social Connections: Not on file  Intimate Partner Violence: Not on file    Review of Systems:    Constitutional: No weight loss, fever or chills Skin: No rash  Cardiovascular: No chest pain Respiratory: No  SOB Gastrointestinal: See HPI and otherwise negative Genitourinary: No dysuria Neurological: No headache, dizziness or syncope Musculoskeletal: No new muscle or joint pain Hematologic: No bleeding  Psychiatric: No history of depression or anxiety   Physical Exam:  Vital signs: BP 128/82 (BP Location: Left Wrist, Patient Position: Sitting, Cuff Size: Normal)    Pulse 88    Ht 5' (1.524 m) Comment: height measured without shoes   Wt (!) 393 lb 8 oz (178.5 kg)    BMI 76.85 kg/m    Constitutional:   Pleasant Morbidly obese AA female appears to be in NAD, Well developed, Well nourished, alert and cooperative Head:  Normocephalic and atraumatic. Eyes:   PEERL, EOMI. No icterus. Conjunctiva pink. Ears:  Normal auditory acuity. Neck:  Supple Throat: Oral cavity and pharynx without inflammation, swelling or lesion.   Respiratory: Respirations even and unlabored. Lungs clear to auscultation bilaterally.   No wheezes, crackles, or rhonchi.  Cardiovascular: Normal S1, S2. No MRG. Regular rate and rhythm. No peripheral edema, cyanosis or pallor.  Gastrointestinal:  Soft, nondistended, nontender. No rebound or guarding. Normal bowel sounds. No appreciable masses or hepatomegaly. Rectal:  Not performed.  Msk:  Symmetrical without gross deformities. Without edema, no deformity or joint abnormality.  Neurologic:  Alert and  oriented x4;  grossly normal neurologically.  Skin:   Dry and intact without significant lesions or rashes. Psychiatric: Demonstrates good judgement and reason without abnormal affect or behaviors.  No recent labs or imaging.  Assessment: 1.  Screening for colorectal cancer: Patient is 16 and due for a screening colonoscopy 2.  Morbid obesity  Plan: 1.  Due to patient's BMI we will schedule her for a screening colonoscopy at the hospital with Dr. Tomasa Rand.  Did provide the patient a detailed list of risks for the procedure and she agrees to proceed.  She was scheduled on March 13. 2.  Patient to follow in clinic per recommendations after time of procedure.  April Meeker, PA-C [facility] 12/03/2021, 10:39 AM  Cc: Quitman Livings, MD

## 2021-12-08 ENCOUNTER — Encounter: Payer: BC Managed Care – PPO | Admitting: Gastroenterology

## 2022-01-01 ENCOUNTER — Telehealth: Payer: Self-pay

## 2022-01-01 DIAGNOSIS — Z1211 Encounter for screening for malignant neoplasm of colon: Secondary | ICD-10-CM

## 2022-01-01 NOTE — Telephone Encounter (Signed)
Patient contacted about rescheduling her colonoscopy at [facility] to 03/08/22 due to unavailability on 02/22/22.  She is agreeable.  New instructions mailed to patient.

## 2022-02-24 ENCOUNTER — Telehealth: Payer: Self-pay

## 2022-02-24 NOTE — Telephone Encounter (Signed)
Called and spoke with patient to confirm her colonoscopy appt at [facility] on 03/08/22. Patient is aware that she will need to arrive at [facility] by 8:45 am with a care partner. Pt confirmed that she has her prep and instructions. Pt had no concerns at the end of the call. ?

## 2022-02-25 ENCOUNTER — Encounter (HOSPITAL_COMMUNITY): Payer: Self-pay | Admitting: Gastroenterology

## 2022-02-26 NOTE — Progress Notes (Signed)
Attempted to obtain medical history via telephone, unable to reach at this time. I left a voicemail to return pre surgical testing department's phone call.  

## 2022-03-08 ENCOUNTER — Ambulatory Visit (HOSPITAL_COMMUNITY)
Admission: RE | Admit: 2022-03-08 | Discharge: 2022-03-08 | Disposition: A | Discharge status: Home / Self Care | Payer: BC Managed Care – PPO | Attending: Gastroenterology | Admitting: Gastroenterology

## 2022-03-08 ENCOUNTER — Encounter (HOSPITAL_COMMUNITY): Payer: Self-pay | Admitting: Gastroenterology

## 2022-03-08 ENCOUNTER — Other Ambulatory Visit: Payer: Self-pay

## 2022-03-08 ENCOUNTER — Encounter (HOSPITAL_COMMUNITY)
Admission: RE | Disposition: A | Discharge status: Home / Self Care | Payer: Self-pay | Source: Home / Self Care | Attending: Gastroenterology

## 2022-03-08 ENCOUNTER — Ambulatory Visit (HOSPITAL_COMMUNITY): Payer: BC Managed Care – PPO | Admitting: Anesthesiology

## 2022-03-08 DIAGNOSIS — F419 Anxiety disorder, unspecified: Secondary | ICD-10-CM | POA: Insufficient documentation

## 2022-03-08 DIAGNOSIS — I10 Essential (primary) hypertension: Secondary | ICD-10-CM | POA: Insufficient documentation

## 2022-03-08 DIAGNOSIS — E039 Hypothyroidism, unspecified: Secondary | ICD-10-CM | POA: Diagnosis not present

## 2022-03-08 DIAGNOSIS — K5730 Diverticulosis of large intestine without perforation or abscess without bleeding: Secondary | ICD-10-CM | POA: Insufficient documentation

## 2022-03-08 DIAGNOSIS — F32A Depression, unspecified: Secondary | ICD-10-CM | POA: Insufficient documentation

## 2022-03-08 DIAGNOSIS — G4730 Sleep apnea, unspecified: Secondary | ICD-10-CM | POA: Diagnosis not present

## 2022-03-08 DIAGNOSIS — Z1211 Encounter for screening for malignant neoplasm of colon: Secondary | ICD-10-CM | POA: Diagnosis not present

## 2022-03-08 DIAGNOSIS — Z6841 Body Mass Index (BMI) 40.0 and over, adult: Secondary | ICD-10-CM | POA: Diagnosis not present

## 2022-03-08 DIAGNOSIS — J449 Chronic obstructive pulmonary disease, unspecified: Secondary | ICD-10-CM | POA: Insufficient documentation

## 2022-03-08 HISTORY — PX: COLONOSCOPY WITH PROPOFOL: SHX5780

## 2022-03-08 SURGERY — COLONOSCOPY WITH PROPOFOL
Anesthesia: Monitor Anesthesia Care

## 2022-03-08 MED ORDER — SODIUM CHLORIDE 0.9 % IV SOLN: INTRAVENOUS | Status: DC

## 2022-03-08 MED ORDER — LACTATED RINGERS IV SOLN: INTRAVENOUS | Status: DC

## 2022-03-08 MED ORDER — PROPOFOL 500 MG/50ML IV EMUL
INTRAVENOUS | Status: DC | PRN
Start: 2022-03-08 — End: 2022-03-08
  Administered 2022-03-08: 110 ug/kg/min via INTRAVENOUS
  Administered 2022-03-08: 30 mg via INTRAVENOUS

## 2022-03-08 SURGICAL SUPPLY — 22 items

## 2022-03-08 NOTE — Discharge Instructions (Addendum)
YOU HAD AN ENDOSCOPIC PROCEDURE TODAY: Refer to the procedure report and other information in the discharge instructions given to you for any specific questions about what was found during the examination. If this information does not answer your questions, please call [place] office at [phone] to clarify.  ? ?YOU SHOULD EXPECT: Some feelings of bloating in the abdomen. Passage of more gas than usual. Walking can help get rid of the air that was put into your GI tract during the procedure and reduce the bloating. If you had a lower endoscopy (such as a colonoscopy or flexible sigmoidoscopy) you may notice spotting of blood in your stool or on the toilet paper. Some abdominal soreness may be present for a day or two, also. ? ?DIET: Your first meal following the procedure should be a light meal and then it is ok to progress to your normal diet. A half-sandwich or bowl of soup is an example of a good first meal. Heavy or fried foods are harder to digest and may make you feel nauseous or bloated. Drink plenty of fluids but you should avoid alcoholic beverages for 24 hours. If you had a esophageal dilation, please see attached instructions for diet.   ? ?ACTIVITY: Your care partner should take you home directly after the procedure. You should plan to take it easy, moving slowly for the rest of the day. You can resume normal activity the day after the procedure however YOU SHOULD NOT DRIVE, use power tools, machinery or perform tasks that involve climbing or major physical exertion for 24 hours (because of the sedation medicines used during the test).  ? ?SYMPTOMS TO REPORT IMMEDIATELY: ?A gastroenterologist can be reached at any hour. Please call [phone]  for any of the following symptoms:  ?Following lower endoscopy (colonoscopy, flexible sigmoidoscopy) ?Excessive amounts of blood in the stool  ?Significant tenderness, worsening of abdominal pains  ?Swelling of the abdomen that is new, acute  ?Fever of 100? or  higher  ?Following upper endoscopy (EGD, EUS, ERCP, esophageal dilation) ?Vomiting of blood or coffee ground material  ?New, significant abdominal pain  ?New, significant chest pain or pain under the shoulder blades  ?Painful or persistently difficult swallowing  ?New shortness of breath  ?Black, tarry-looking or red, bloody stools ? ?FOLLOW UP:  ?If any biopsies were taken you will be contacted by phone or by letter within the next 1-3 weeks. Call [phone]  if you have not heard about the biopsies in 3 weeks.  ?Please also call with any specific questions about appointments or follow up tests.  ?  ? ? ?[facility]  HOSPITAL ENDOSCOPY ?[address] ?[place], [place]  [address] ?Phone:  [phone] ? ? ?March 08, 2022 ? ?Patient: April Ortega  ?Date of Birth: 18-Oct-1971  ?Date of Visit: March 08, 2022  ? ? ?To Whom It May Concern: ? ?April Ortega was seen and treated on March 08, 2022  at [facility]. She may return to normal activity on Tuesday 03/09/2022. Her son, Clois Dupes was her care partner on March 08, 2022 and was her ride home after her procedure.   .  ? ?    ? ?  ? ?If you have any questions or concerns, please don't hesitate to call [phone]. ? ? ?Sincerely,  ? ? ? ? ? ?Treatment Team:  ?Attending Provider: Sherrilyn Rist, MD ? ?  ? ?  ? ?

## 2022-03-08 NOTE — Anesthesia Postprocedure Evaluation (Signed)
Anesthesia Post Note ? ?Patient: April Ortega ? ?Procedure(s) Performed: COLONOSCOPY WITH PROPOFOL ? ?  ? ?Patient location during evaluation: Endoscopy ?Anesthesia Type: MAC ?Level of consciousness: awake and alert, oriented and patient cooperative ?Pain management: pain level controlled ?Vital Signs Assessment: post-procedure vital signs reviewed and stable ?Respiratory status: spontaneous breathing, nonlabored ventilation and respiratory function stable ?Cardiovascular status: blood pressure returned to baseline and stable ?Postop Assessment: no apparent nausea or vomiting ?Anesthetic complications: no ? ? ?No notable events documented. ? ?Last Vitals:  ?Vitals:  ? 03/08/22 0918 03/08/22 1108  ?BP: (!) 156/96 120/75  ?Pulse:  96  ?Resp: 18 16  ?Temp: 36.9 ?C   ?SpO2: 96% 98%  ?  ?Last Pain:  ?Vitals:  ? 03/08/22 1108  ?TempSrc:   ?PainSc: 0-No pain  ? ? ?  ?  ?  ?  ?  ?  ? ?Jacobi Nile,E. Sharlett Lienemann ? ? ? ? ?

## 2022-03-08 NOTE — Anesthesia Preprocedure Evaluation (Addendum)
Anesthesia Evaluation  ?Patient identified by MRN, date of birth, ID band ?Patient awake ? ? ? ?Reviewed: ?Allergy & Precautions, NPO status , Patient's Chart, lab work & pertinent test results ? ?History of Anesthesia Complications ?Negative for: history of anesthetic complications ? ?Airway ?Mallampati: II ? ?TM Distance: >3 FB ?Neck ROM: Full ? ? ? Dental ? ?(+) Dental Advisory Given ?  ?Pulmonary ?sleep apnea (does not use CPAP) , COPD (has not used inhaler in many months),  ?  ?breath sounds clear to auscultation ? ? ? ? ? ? Cardiovascular ?hypertension, Pt. on medications ?(-) angina ?Rhythm:Regular Rate:Normal ? ? ?  ?Neuro/Psych ?Anxiety Depression negative neurological ROS ?   ? GI/Hepatic ?negative GI ROS, Neg liver ROS,   ?Endo/Other  ?Hypothyroidism Morbid obesity ? Renal/GU ?negative Renal ROS  ? ?  ?Musculoskeletal ? ? Abdominal ?(+) + obese,   ?Peds ? Hematology ?  ?Anesthesia Other Findings ? ? Reproductive/Obstetrics ?IUD ? ?  ? ? ? ? ? ? ? ? ? ? ? ? ? ?  ?  ? ? ? ? ? ? ? ?Anesthesia Physical ?Anesthesia Plan ? ?ASA: 4 ? ?Anesthesia Plan: MAC  ? ?Post-op Pain Management: Minimal or no pain anticipated  ? ?Induction:  ? ?PONV Risk Score and Plan: 2 and Treatment may vary due to age or medical condition ? ?Airway Management Planned: Natural Airway and Simple Face Mask ? ?Additional Equipment: None ? ?Intra-op Plan:  ? ?Post-operative Plan:  ? ?Informed Consent: I have reviewed the patients History and Physical, chart, labs and discussed the procedure including the risks, benefits and alternatives for the proposed anesthesia with the patient or authorized representative who has indicated his/her understanding and acceptance.  ? ? ? ?Dental advisory given ? ?Plan Discussed with: CRNA and Surgeon ? ?Anesthesia Plan Comments:   ? ? ? ? ? ?Anesthesia Quick Evaluation ? ?

## 2022-03-08 NOTE — Op Note (Signed)
[facility] ?Patient Name: April Ortega ?Procedure Date: 03/08/2022 ?MRN: [id number] ?Attending MD: Estill Cotta. Loletha Carrow , MD ?Date of Birth: [birth year] ?CSN: [id number] ?Age: 51 ?Admit Type: Outpatient ?Procedure:                Colonoscopy ?Indications:              Screening for colorectal malignant neoplasm, This  ?                          is the patient's first colonoscopy ?Providers:                Estill Cotta. Loletha Carrow, MD, Dulcy Fanny, Charlean Merl  ?                          Purcell Nails, Technician ?Referring MD:             Dr. Dustin Flock ?Medicines:                Monitored Anesthesia Care ?Complications:            No immediate complications. ?Estimated Blood Loss:     Estimated blood loss: none. ?Procedure:                Pre-Anesthesia Assessment: ?                          - Prior to the procedure, a History and Physical  ?                          was performed, and patient medications and  ?                          allergies were reviewed. The patient's tolerance of  ?                          previous anesthesia was also reviewed. The risks  ?                          and benefits of the procedure and the sedation  ?                          options and risks were discussed with the patient.  ?                          All questions were answered, and informed consent  ?                          was obtained. Prior Anticoagulants: The patient has  ?                          taken no previous anticoagulant or antiplatelet  ?                          agents. ASA Grade Assessment: III - A patient with  ?  severe systemic disease. After reviewing the risks  ?                          and benefits, the patient was deemed in  ?                          satisfactory condition to undergo the procedure. ?                          After obtaining informed consent, the colonoscope  ?                          was passed under direct vision. Throughout the  ?                           procedure, the patient's blood pressure, pulse, and  ?                          oxygen saturations were monitored continuously. The  ?                          CF-HQ190L ([id number]) Olympus colonoscope was  ?                          introduced through the anus and advanced to the the  ?                          cecum, identified by appendiceal orifice and  ?                          ileocecal valve. The colonoscopy was somewhat  ?                          difficult due to a redundant colon and the  ?                          patient's body habitus. Successful completion of  ?                          the procedure was aided by using manual pressure  ?                          and straightening and shortening the scope to  ?                          obtain bowel loop reduction. The patient tolerated  ?                          the procedure well. The quality of the bowel  ?                          preparation was excellent. The ileocecal valve,  ?  appendiceal orifice, and rectum were photographed. ?Scope In: 10:44:07 AM ?Scope Out: 11:01:37 AM ?Scope Withdrawal Time: 0 hours 10 minutes 32 seconds  ?Total Procedure Duration: 0 hours 17 minutes 30 seconds  ?Findings: ?     The perianal and digital rectal examinations were normal. ?     A single diverticulum was found in the proximal ascending colon. ?     The exam was otherwise without abnormality on direct and retroflexion  ?     views. ?Impression:               - Diverticulosis in the proximal ascending colon. ?                          - The examination was otherwise normal on direct  ?                          and retroflexion views. ?                          - No specimens collected. ?Moderate Sedation: ?     MAC sedation used ?Recommendation:           - Patient has a contact number available for  ?                          emergencies. The signs and symptoms of potential  ?                          delayed complications were  discussed with the  ?                          patient. Return to normal activities tomorrow.  ?                          Written discharge instructions were provided to the  ?                          patient. ?                          - Resume previous diet. ?                          - Continue present medications. ?                          - Repeat colonoscopy in 10 years for screening  ?                          purposes. ?                          - Return to GI office (Dr. Candis Schatz at [facility]) PRN. ?Procedure Code(s):        --- Professional --- ?                          H2094, Colorectal cancer screening; colonoscopy on  ?  individual not meeting criteria for high risk ?Diagnosis Code(s):        --- Professional --- ?                          Z12.11, Encounter for screening for malignant  ?                          neoplasm of colon ?                          K57.30, Diverticulosis of large intestine without  ?                          perforation or abscess without bleeding ?CPT copyright 2019 American Medical Association. All rights reserved. ?The codes documented in this report are preliminary and upon coder review may  ?be revised to meet current compliance requirements. ?Cesareo Vickrey L. Loletha Carrow, MD ?03/08/2022 11:15:26 AM ?This report has been signed electronically. ?Number of Addenda: 0 ?

## 2022-03-08 NOTE — Interval H&P Note (Signed)
History and Physical Interval Note: ? ?03/08/2022 ?10:35 AM ? ?April Ortega  has presented today for surgery, with the diagnosis of colon cancer screening.  The various methods of treatment have been discussed with the patient and family. After consideration of risks, benefits and other options for treatment, the patient has consented to  Procedure(s): ?COLONOSCOPY WITH PROPOFOL (N/A) as a surgical intervention.  The patient's history has been reviewed, patient examined, no change in status, stable for surgery.  I have reviewed the patient's chart and labs.  Questions were answered to the patient's satisfaction.   ? ? ?[name] ? ? ?

## 2022-03-08 NOTE — H&P (Signed)
History and Physical: ? This patient presents for endoscopic testing for: ?Colon cancer screening ? ?Clinical details in [place] GI office note of 12/03/2021.  This patient was seen by our APP and supervised by Dr. Tiajuana Amass with a consult for colorectal cancer screening.  Patient was offered a screening colonoscopy in the hospital outpatient setting due to her elevated BMI. ?Patient denies altered bowel habits or rectal bleeding. ? ?ROS: ?Patient denies chest pain or shortness of breath ? ? ?Past Medical History: ?Past Medical History:  ?Diagnosis Date  ? Anxiety   ? Asthma   ? Depression   ? H/O seasonal allergies   ? Hypertension   ? Hypothyroidism   ? Obesity   ? Sciatica   ? Sleep apnea   ? ? ? ?Past Surgical History: ?Past Surgical History:  ?Procedure Laterality Date  ? BREAST CYST EXCISION Left   ? ? ?Allergies: ?Allergies  ?Allergen Reactions  ? Sulfa Antibiotics Itching and Swelling  ? Penicillins Rash  ? ? ?Outpatient Meds: ?Current Facility-Administered Medications  ?Medication Dose Route Frequency Provider Last Rate Last Admin  ? 0.9 %  sodium chloride infusion   Intravenous Continuous Unk Lightning, PA      ? lactated ringers infusion   Intravenous Continuous Danis, Andreas Blower, MD      ? ? ? ? ?___________________________________________________________________ ?Objective  ? ?Exam: ? ?BP (!) 156/96   Temp 98.5 ?F (36.9 ?C) (Oral)   Resp 18   Ht 5\' 5"  (1.651 m)   Wt (!) 173.7 kg   SpO2 96%   BMI 63.73 kg/m?  ? ?CV: RRR without murmur, S1/S2 ?Resp: clear to auscultation bilaterally, normal RR and effort noted ?GI: soft, no tenderness, with active bowel sounds.  Morbidly obese ? ? ?Assessment: ?Average risk for colorectal cancer ?Morbid obesity, increasing risk of sedation requiring hospital-based outpatient endoscopy setting ? ?Plan: ?Colonoscopy ? The benefits and risks of the planned procedure were described in detail with the patient or (when appropriate) their health care  proxy.  Risks were outlined as including, but not limited to, bleeding, infection, perforation, adverse medication reaction leading to cardiac or pulmonary decompensation, pancreatitis (if ERCP).  The limitation of incomplete mucosal visualization was also discussed.  No guarantees or warranties were given. ? ? ? ?The patient is appropriate for an endoscopic procedure in the ambulatory setting. ? ? - , MD ? ? ? ? ? ?

## 2022-03-08 NOTE — Transfer of Care (Signed)
Immediate Anesthesia Transfer of Care Note ? ?Patient: April Ortega ? ?Procedure(s) Performed: Procedure(s): ?COLONOSCOPY WITH PROPOFOL (N/A) ? ?Patient Location: PACU and Endoscopy Unit ? ?Anesthesia Type:MAC ? ?Level of Consciousness: awake, alert  and oriented ? ?Airway & Oxygen Therapy: Patient Spontanous Breathing and Patient connected to nasal cannula oxygen ? ?Post-op Assessment: Report given to RN and Post -op Vital signs reviewed and stable ? ?Post vital signs: Reviewed and stable ? ?Last Vitals:  ?Vitals:  ? 03/08/22 0918  ?BP: (!) 156/96  ?Resp: 18  ?Temp: 36.9 ?C  ?SpO2: 96%  ? ? ?Complications: No apparent anesthesia complications ? ?

## 2022-10-21 ENCOUNTER — Other Ambulatory Visit (HOSPITAL_COMMUNITY): Payer: Self-pay

## 2022-10-21 MED ORDER — SEMAGLUTIDE-WEIGHT MANAGEMENT 1 MG/0.5ML ~~LOC~~ SOAJ
1.0000 mg | SUBCUTANEOUS | 0 refills | Status: AC
  Filled 2022-10-21: qty 2, 28d supply, fill #0

## 2023-01-12 ENCOUNTER — Other Ambulatory Visit (HOSPITAL_COMMUNITY): Payer: Self-pay

## 2023-02-09 ENCOUNTER — Other Ambulatory Visit (HOSPITAL_BASED_OUTPATIENT_CLINIC_OR_DEPARTMENT_OTHER): Payer: Self-pay

## 2023-02-11 ENCOUNTER — Other Ambulatory Visit (HOSPITAL_BASED_OUTPATIENT_CLINIC_OR_DEPARTMENT_OTHER): Payer: Self-pay

## 2023-02-14 ENCOUNTER — Other Ambulatory Visit (HOSPITAL_BASED_OUTPATIENT_CLINIC_OR_DEPARTMENT_OTHER): Payer: Self-pay

## 2023-02-14 MED ORDER — SEMAGLUTIDE-WEIGHT MANAGEMENT 2.4 MG/0.75ML ~~LOC~~ SOAJ
2.4000 mg | SUBCUTANEOUS | 0 refills | Status: AC
  Filled 2023-02-14 – 2023-10-14 (×4): qty 3, 28d supply, fill #0

## 2023-02-14 MED ORDER — SEMAGLUTIDE-WEIGHT MANAGEMENT 1 MG/0.5ML ~~LOC~~ SOAJ
1.0000 mg | SUBCUTANEOUS | 0 refills | Status: AC
  Filled 2023-02-14 – 2023-02-18 (×2): qty 2, 28d supply, fill #0

## 2023-02-18 ENCOUNTER — Other Ambulatory Visit (HOSPITAL_COMMUNITY): Payer: Self-pay

## 2023-02-19 ENCOUNTER — Other Ambulatory Visit (HOSPITAL_COMMUNITY): Payer: Self-pay

## 2023-02-21 ENCOUNTER — Other Ambulatory Visit (HOSPITAL_BASED_OUTPATIENT_CLINIC_OR_DEPARTMENT_OTHER): Payer: Self-pay

## 2023-05-25 ENCOUNTER — Other Ambulatory Visit (HOSPITAL_COMMUNITY): Payer: Self-pay

## 2023-09-05 ENCOUNTER — Ambulatory Visit: Payer: BC Managed Care – PPO | Admitting: Advanced Practice Midwife

## 2023-09-09 ENCOUNTER — Other Ambulatory Visit (HOSPITAL_COMMUNITY): Payer: Self-pay

## 2023-09-20 ENCOUNTER — Other Ambulatory Visit: Payer: Self-pay

## 2023-09-28 ENCOUNTER — Other Ambulatory Visit (HOSPITAL_COMMUNITY): Payer: Self-pay

## 2023-09-30 ENCOUNTER — Other Ambulatory Visit (HOSPITAL_COMMUNITY): Payer: Self-pay

## 2023-10-11 ENCOUNTER — Other Ambulatory Visit (HOSPITAL_COMMUNITY): Payer: Self-pay

## 2023-10-14 ENCOUNTER — Other Ambulatory Visit (HOSPITAL_BASED_OUTPATIENT_CLINIC_OR_DEPARTMENT_OTHER): Payer: Self-pay

## 2023-10-17 ENCOUNTER — Ambulatory Visit: Payer: BC Managed Care – PPO | Admitting: Advanced Practice Midwife

## 2023-10-25 ENCOUNTER — Other Ambulatory Visit (HOSPITAL_BASED_OUTPATIENT_CLINIC_OR_DEPARTMENT_OTHER): Payer: Self-pay

## 2023-11-21 ENCOUNTER — Ambulatory Visit: Payer: BC Managed Care – PPO | Admitting: Obstetrics and Gynecology

## 2024-02-02 ENCOUNTER — Ambulatory Visit: Payer: BC Managed Care – PPO | Admitting: Obstetrics and Gynecology

## 2024-02-28 ENCOUNTER — Other Ambulatory Visit (HOSPITAL_COMMUNITY)
Admission: RE | Admit: 2024-02-28 | Discharge: 2024-02-28 | Disposition: A | Discharge status: Home / Self Care | Source: Ambulatory Visit | Attending: Obstetrics and Gynecology | Admitting: Obstetrics and Gynecology

## 2024-02-28 ENCOUNTER — Encounter: Payer: Self-pay | Admitting: Obstetrics and Gynecology

## 2024-02-28 ENCOUNTER — Ambulatory Visit: Payer: BC Managed Care – PPO | Admitting: Obstetrics and Gynecology

## 2024-02-28 VITALS — BP 165/100 | HR 66 | Ht 65.0 in | Wt >= 6400 oz

## 2024-02-28 DIAGNOSIS — Z01419 Encounter for gynecological examination (general) (routine) without abnormal findings: Secondary | ICD-10-CM | POA: Diagnosis present

## 2024-02-28 NOTE — Progress Notes (Signed)
 Pt is in the office for annual Currently has Mirena IUD in place, inserted 11/13/2018 Last pap 10/02/2021 Pt reports last mammogram over a year ago Desires std testing today Pt states that she forgot to take BP med today

## 2024-02-28 NOTE — Progress Notes (Signed)
 Subjective:     April Ortega is a 53 y.o. female with BMI 68 and amenorrhea related to Mirena IUD who is here for a comprehensive physical exam. The patient reports no problems. Patient has had MIrena IUD inserted 11/2018 and reports a monthly episode of vaginal spotting which typically lasts a day. Patient is sexually active without complaints. She denies pelvic pain or abnormal discharge. Patient denies urinary incontinence or issues with bowel movements. She denies any vasomotor symptoms  Past Medical History:  Diagnosis Date   Anxiety    Asthma    Depression    H/O seasonal allergies    Hypertension    Hypothyroidism    Obesity    Sciatica    Sleep apnea    Past Surgical History:  Procedure Laterality Date   BREAST CYST EXCISION Left    COLONOSCOPY WITH PROPOFOL N/A 03/08/2022   Procedure: COLONOSCOPY WITH PROPOFOL;  Surgeon: Sherrilyn Rist, MD;  Location: [facility];  Service: Gastroenterology;  Laterality: N/A;   Family History  Problem Relation Age of Onset   Hypertension Mother    Thyroid disease Mother    Drug abuse Mother    Depression Mother    Mental illness Mother    Heart disease Maternal Grandmother    Breast cancer Maternal Aunt    Diabetes Maternal Aunt    Heart disease Maternal Aunt    Anemia Maternal Aunt    Anemia Daughter     Social History   Socioeconomic History   Marital status: Widowed    Spouse name: Not on file   Number of children: 3   Years of education: Not on file   Highest education level: Not on file  Occupational History   Occupation: Teacher  Tobacco Use   Smoking status: Never   Smokeless tobacco: Never  Vaping Use   Vaping status: Never Used  Substance and Sexual Activity   Alcohol use: Yes    Comment: social maybe twice a year   Drug use: No   Sexual activity: Yes    Birth control/protection: I.U.D.  Other Topics Concern   Not on file  Social History Narrative   Not on file   Social Drivers of Health    Financial Resource Strain: Not on file  Food Insecurity: Not on file  Transportation Needs: Not on file  Physical Activity: Not on file  Stress: Not on file  Social Connections: Unknown (04/23/2022)   Received from [facility], [facility]   Social Network    Social Network: Not on file  Intimate Partner Violence: Unknown (03/16/2022)   Received from [facility], [facility]   HITS    Physically Hurt: Not on file    Insult or Talk Down To: Not on file    Threaten Physical Harm: Not on file    Scream or Curse: Not on file   Health Maintenance  Topic Date Due   Pneumococcal Vaccine 56-64 Years old (1 of 2 - PCV) Never done   Zoster Vaccines- Shingrix (1 of 2) Never done   COVID-19 Vaccine (3 - Pfizer risk series) 09/01/2020   MAMMOGRAM  05/23/2023   INFLUENZA VACCINE  Never done   Cervical Cancer Screening (HPV/Pap Cotest)  10/02/2026   DTaP/Tdap/Td (2 - Td or Tdap) 05/08/2029   Colonoscopy  03/08/2032   Hepatitis C Screening  Completed   HIV Screening  Completed   HPV VACCINES  Aged Out       Review of Systems Pertinent items  noted in HPI and remainder of comprehensive ROS otherwise negative.   Objective:  Blood pressure (!) 165/100, pulse 66, height 5\' 5"  (1.651 m), weight (!) 410 lb 9.6 oz (186.2 kg).   GENERAL: Well-developed, well-nourished female in no acute distress.  HEENT: Normocephalic, atraumatic. Sclerae anicteric.  NECK: Supple. Normal thyroid.  LUNGS: Clear to auscultation bilaterally.  HEART: Regular rate and rhythm. BREASTS: Symmetric in size. No palpable masses or lymphadenopathy, skin changes, or nipple drainage. ABDOMEN: Soft, nontender, nondistended. No organomegaly. PELVIC: Normal external female genitalia. Vagina is pink and rugated.  Normal discharge. Normal appearing cervix. Uterus is normal in size. No adnexal mass or tenderness. Chaperone present during the pelvic exam EXTREMITIES: No cyanosis, clubbing, or edema, 2+ distal pulses.      Assessment:    Healthy female exam.      Plan:    Pap smear collected Screening mammogram ordered Patient desires STI screening which has been ordered Patient is current on colonoscopy Patient will be contacted with abnormal results Patient is not take BP medication this morning. Patient to follow up as scheduled with PCP Mirena IUD due for removal next year See After Visit Summary for Counseling Recommendations

## 2024-02-29 LAB — HEPATITIS C ANTIBODY: Hep C Virus Ab: NONREACTIVE

## 2024-02-29 LAB — CERVICOVAGINAL ANCILLARY ONLY
Bacterial Vaginitis (gardnerella): POSITIVE — AB
Candida Glabrata: NEGATIVE
Candida Vaginitis: NEGATIVE
Chlamydia: NEGATIVE
Comment: NEGATIVE
Comment: NEGATIVE
Comment: NEGATIVE
Comment: NEGATIVE
Comment: NEGATIVE
Comment: NORMAL
Neisseria Gonorrhea: NEGATIVE
Trichomonas: NEGATIVE

## 2024-02-29 LAB — HEPATITIS B SURFACE ANTIGEN: Hepatitis B Surface Ag: NEGATIVE

## 2024-02-29 LAB — RPR: RPR Ser Ql: NONREACTIVE

## 2024-02-29 LAB — HIV ANTIBODY (ROUTINE TESTING W REFLEX): HIV Screen 4th Generation wRfx: NONREACTIVE

## 2024-03-02 ENCOUNTER — Encounter: Payer: Self-pay | Admitting: Obstetrics and Gynecology

## 2024-03-02 LAB — CYTOLOGY - PAP
Comment: NEGATIVE
Diagnosis: NEGATIVE
High risk HPV: NEGATIVE

## 2024-03-02 MED ORDER — NUVESSA 1.3 % VA GEL: 1.0000 | Freq: Every evening | VAGINAL | 0 refills | Status: AC

## 2024-03-02 NOTE — Addendum Note (Signed)
 Addended by: Catalina Antigua on: 03/02/2024 08:53 AM   Modules accepted: Orders

## 2024-03-27 ENCOUNTER — Ambulatory Visit

## 2024-07-24 ENCOUNTER — Other Ambulatory Visit: Payer: Self-pay | Admitting: Medical Genetics

## 2024-09-19 ENCOUNTER — Other Ambulatory Visit

## 2024-09-19 ENCOUNTER — Other Ambulatory Visit: Payer: Self-pay | Admitting: Nurse Practitioner

## 2024-09-19 DIAGNOSIS — Z1231 Encounter for screening mammogram for malignant neoplasm of breast: Secondary | ICD-10-CM

## 2024-10-10 ENCOUNTER — Encounter (HOSPITAL_BASED_OUTPATIENT_CLINIC_OR_DEPARTMENT_OTHER): Payer: Self-pay | Admitting: Emergency Medicine

## 2024-10-10 ENCOUNTER — Emergency Department (HOSPITAL_BASED_OUTPATIENT_CLINIC_OR_DEPARTMENT_OTHER)

## 2024-10-10 ENCOUNTER — Emergency Department (HOSPITAL_BASED_OUTPATIENT_CLINIC_OR_DEPARTMENT_OTHER)
Admission: EM | Admit: 2024-10-10 | Discharge: 2024-10-10 | Disposition: A | Discharge status: Home / Self Care | Source: Ambulatory Visit | Attending: Emergency Medicine | Admitting: Emergency Medicine

## 2024-10-10 ENCOUNTER — Other Ambulatory Visit: Payer: Self-pay

## 2024-10-10 DIAGNOSIS — E039 Hypothyroidism, unspecified: Secondary | ICD-10-CM | POA: Insufficient documentation

## 2024-10-10 DIAGNOSIS — M25511 Pain in right shoulder: Secondary | ICD-10-CM | POA: Diagnosis not present

## 2024-10-10 DIAGNOSIS — W19XXXA Unspecified fall, initial encounter: Secondary | ICD-10-CM

## 2024-10-10 DIAGNOSIS — Y990 Civilian activity done for income or pay: Secondary | ICD-10-CM | POA: Diagnosis not present

## 2024-10-10 DIAGNOSIS — S199XXA Unspecified injury of neck, initial encounter: Secondary | ICD-10-CM | POA: Diagnosis present

## 2024-10-10 DIAGNOSIS — S161XXA Strain of muscle, fascia and tendon at neck level, initial encounter: Secondary | ICD-10-CM | POA: Diagnosis not present

## 2024-10-10 DIAGNOSIS — J45909 Unspecified asthma, uncomplicated: Secondary | ICD-10-CM | POA: Insufficient documentation

## 2024-10-10 DIAGNOSIS — W010XXA Fall on same level from slipping, tripping and stumbling without subsequent striking against object, initial encounter: Secondary | ICD-10-CM | POA: Diagnosis not present

## 2024-10-10 DIAGNOSIS — Z79899 Other long term (current) drug therapy: Secondary | ICD-10-CM | POA: Diagnosis not present

## 2024-10-10 DIAGNOSIS — I10 Essential (primary) hypertension: Secondary | ICD-10-CM | POA: Diagnosis not present

## 2024-10-10 MED ORDER — LIDOCAINE 5 % EX PTCH
1.0000 | MEDICATED_PATCH | CUTANEOUS | Status: DC
  Administered 2024-10-10: 1 via TRANSDERMAL
  Filled 2024-10-10: qty 1

## 2024-10-10 MED ORDER — METHOCARBAMOL 500 MG PO TABS: 500.0000 mg | ORAL_TABLET | Freq: Two times a day (BID) | ORAL | 0 refills | Status: AC

## 2024-10-10 MED ORDER — NAPROXEN 500 MG PO TABS: 500.0000 mg | ORAL_TABLET | Freq: Two times a day (BID) | ORAL | 0 refills | Status: AC

## 2024-10-10 NOTE — ED Provider Notes (Signed)
 [facility] DEPARTMENT AT [facility] Provider Note   CSN: [id number] Arrival date & time: 10/10/24  1137     Patient presents with: [name]   April Ortega is a 53 y.o. female.   53 year old female with complaint of pain in the right neck/shoulder/arm after fall 2 days ago while at work. Patient tripped over an object on the ground, fell forward and landed on right arm. Did not hit head, no LOC. Patient directed to UC today by her employer and was sent to the ER for imaging/further evaluation. Pain in her right neck/shoulder area, radiates down right arm. Right hand dominant.        Prior to Admission medications   Medication Sig Start Date End Date Taking? Authorizing Provider  chlorthalidone (HYGROTON) 25 MG tablet Take 25 mg by mouth daily. 01/13/23  Yes [provider]  CRESTOR 5 MG tablet Take 5 mg by mouth daily. 09/19/24  Yes [provider]  methocarbamol  (ROBAXIN ) 500 MG tablet Take 1 tablet (500 mg total) by mouth 2 (two) times daily. 10/10/24  Yes Beverley Leita LABOR, PA-C  naproxen  (NAPROSYN ) 500 MG tablet Take 1 tablet (500 mg total) by mouth 2 (two) times daily. 10/10/24  Yes Beverley Leita LABOR, PA-C  amLODipine (NORVASC) 10 MG tablet Take 10 mg by mouth daily. 09/29/21   [provider]  levothyroxine (SYNTHROID) 200 MCG tablet Take 300 mcg by mouth every morning. 09/29/21   [provider]  levothyroxine (SYNTHROID) 25 MCG tablet Take 25 mcg by mouth every morning. 09/29/21   [provider]  loratadine (CLARITIN) 10 MG tablet Take 10 mg by mouth daily as needed for allergies.    [provider]  Multiple Vitamin (MULTIVITAMIN WITH MINERALS) TABS tablet Take 1 tablet by mouth daily.    [provider]  PEG-KCl-NaCl-NaSulf-Na Asc-C (PLENVU ) 140 g SOLR Use as directed for colonoscopy prep. 12/03/21   Beather Delon Gibson, PA  Semaglutide -Weight Management 1 MG/0.5ML SOAJ Inject 1 mg into the skin  once a week as directed. 09/06/22   Anderson, Takela N, FNP  Semaglutide -Weight Management 1 MG/0.5ML SOAJ Inject 1 mg into the skin once a week. Patient not taking: Reported on 02/28/2024 11/17/22     Semaglutide -Weight Management 2.4 MG/0.75ML SOAJ Inject 2.4 mg into the skin once a week. Patient not taking: Reported on 02/28/2024 11/17/22       Allergies: Sulfa antibiotics and Penicillins    Review of Systems Negative except as per HPI Updated Vital Signs BP 117/75 (BP Location: Right Arm)   Pulse 73   Temp 97.6 F (36.4 C)   Resp 17   SpO2 96%   Physical Exam Vitals and nursing note reviewed.  Constitutional:      General: She is not in acute distress.    Appearance: She is well-developed. She is not diaphoretic.  HENT:     Head: Normocephalic and atraumatic.  Cardiovascular:     Pulses: Normal pulses.  Pulmonary:     Effort: Pulmonary effort is normal.  Musculoskeletal:        General: Tenderness present. No swelling or deformity.     Right shoulder: Tenderness present. No swelling, deformity, effusion, laceration, bony tenderness or crepitus.     Right elbow: Normal. No swelling, deformity, effusion or lacerations. Normal range of motion. No tenderness.     Right forearm: Normal. No swelling, edema, deformity, lacerations, tenderness or bony tenderness.     Right wrist: Normal. No swelling, deformity, effusion,  tenderness, bony tenderness, snuff box tenderness or crepitus. Normal range of motion.     Right hand: Normal.       Arms:     Cervical back: Tenderness and bony tenderness present.     Thoracic back: No tenderness or bony tenderness.       Back:  Skin:    General: Skin is warm and dry.     Findings: No bruising, erythema or rash.  Neurological:     Mental Status: She is alert and oriented to person, place, and time.     Sensory: No sensory deficit.     Motor: No weakness.  Psychiatric:        Behavior: Behavior normal.     (all labs ordered are listed,  but only abnormal results are displayed) Labs Reviewed - No data to display  EKG: None  Radiology: CT Cervical Spine Wo Contrast Result Date: 10/10/2024 EXAM: CT CERVICAL SPINE WITHOUT CONTRAST 10/10/2024 01:43:16 PM TECHNIQUE: CT of the cervical spine was performed without the administration of intravenous contrast. Multiplanar reformatted images are provided for review. Automated exposure control, iterative reconstruction, and/or weight based adjustment of the mA/kV was utilized to reduce the radiation dose to as low as reasonably achievable. COMPARISON: None available. CLINICAL HISTORY: Neck trauma, midline tenderness (Age 41-64y). Neck trauma; Midline tenderness. FINDINGS: CERVICAL SPINE: BONES AND ALIGNMENT: Straightening and slight reversal of the normal cervical lordosis. No acute fracture or traumatic malalignment. DEGENERATIVE CHANGES: Mild degenerative endplate osteophytes in the lower cervical spine. SOFT TISSUES: No prevertebral soft tissue swelling. IMPRESSION: 1. No acute abnormality of the cervical spine. Electronically signed by: Donnice Mania MD 10/10/2024 02:49 PM EDT RP Workstation: [id number]     Procedures   Medications Ordered in the ED  lidocaine (LIDODERM) 5 % 1 patch (1 patch Transdermal Patch Applied 10/10/24 1234)                                    Medical Decision Making Amount and/or Complexity of Data Reviewed Radiology: ordered.  Risk Prescription drug management.   This patient presents to the ED for concern of right neck/upper extremity pain after fall, this involves an extensive number of treatment options, and is a complaint that carries with it a high risk of complications and morbidity.  The differential diagnosis includes stenosis, herniated disc, fracture, strain   Co morbidities / Chronic conditions that complicate the patient evaluation  HTN, asthma, depression, anxiety, obesity, sleep apnea, hypothyroid    Additional history  obtained:  Additional history obtained from EMR External records from outside source obtained and reviewed including no prior c-spine CT on file for comparison    Imaging Studies ordered:  I ordered imaging studies including ct c-spine for midline c-spine tenderness on exam  I independently visualized and interpreted imaging which showed no acute process I agree with the radiologist interpretation   Problem List / ED Course / Critical interventions / Medication management  53 yo female with fall 2 days ago, pain from neck down right arm. No significant pain with ROM/palpation to the right arm, doubt fracture. Does have TTP upper c-spine area, obtained CT c-spine which showed no acute process. Patient dc to follow up with [facility] provider. Can start Robaxin  and naproxen  as prescribed.  I ordered medication including lidoderm   Reevaluation of the patient after these medicines showed that the patient remained stable I have reviewed the patients home medicines  and have made adjustments as needed   Social Determinants of Health:  Work related injury   Test / Admission - Considered:  Stable for dc, recheck with [facility] provider      Final diagnoses:  Fall, initial encounter  Cervical strain, acute, initial encounter    ED Discharge Orders          Ordered    methocarbamol  (ROBAXIN ) 500 MG tablet  2 times daily        10/10/24 1452    naproxen  (NAPROSYN ) 500 MG tablet  2 times daily        10/10/24 1452               Beverley Leita LABOR, PA-C 10/10/24 1459    Tonia Chew, MD 10/11/24 [phone]

## 2024-10-10 NOTE — Discharge Instructions (Addendum)
 Follow up with your workers comp provider for recheck. Can take Robaxin  as prescribed. Do not take if driving/operating machinery.  Take Naproxen  with food as prescribed. Can apply a warm compress to right shoulder/trapezius area for 20 minutes at a time and follow with gentle stretching.

## 2024-10-10 NOTE — ED Triage Notes (Signed)
 Fall at work on [employer] Pain on right side Reports pain in leck and right wrist

## 2024-10-15 ENCOUNTER — Ambulatory Visit

## 2024-11-22 ENCOUNTER — Ambulatory Visit
Admission: RE | Admit: 2024-11-22 | Discharge: 2024-11-22 | Disposition: A | Discharge status: Home / Self Care | Source: Ambulatory Visit | Attending: Occupational Medicine | Admitting: Occupational Medicine

## 2024-11-22 ENCOUNTER — Other Ambulatory Visit: Payer: Self-pay | Admitting: Occupational Medicine

## 2024-11-22 DIAGNOSIS — S335XXA Sprain of ligaments of lumbar spine, initial encounter: Secondary | ICD-10-CM

## 2024-11-22 DIAGNOSIS — S43401A Unspecified sprain of right shoulder joint, initial encounter: Secondary | ICD-10-CM

## 2024-12-03 ENCOUNTER — Ambulatory Visit

## 2024-12-07 ENCOUNTER — Emergency Department (HOSPITAL_BASED_OUTPATIENT_CLINIC_OR_DEPARTMENT_OTHER): Admitting: Radiology

## 2024-12-07 ENCOUNTER — Other Ambulatory Visit: Payer: Self-pay

## 2024-12-07 ENCOUNTER — Emergency Department (HOSPITAL_BASED_OUTPATIENT_CLINIC_OR_DEPARTMENT_OTHER)
Admission: EM | Admit: 2024-12-07 | Discharge: 2024-12-07 | Disposition: A | Discharge status: Home / Self Care | Attending: Emergency Medicine | Admitting: Emergency Medicine

## 2024-12-07 DIAGNOSIS — E039 Hypothyroidism, unspecified: Secondary | ICD-10-CM | POA: Diagnosis not present

## 2024-12-07 DIAGNOSIS — Z79899 Other long term (current) drug therapy: Secondary | ICD-10-CM | POA: Insufficient documentation

## 2024-12-07 DIAGNOSIS — J069 Acute upper respiratory infection, unspecified: Secondary | ICD-10-CM | POA: Diagnosis not present

## 2024-12-07 DIAGNOSIS — J45909 Unspecified asthma, uncomplicated: Secondary | ICD-10-CM | POA: Insufficient documentation

## 2024-12-07 DIAGNOSIS — I10 Essential (primary) hypertension: Secondary | ICD-10-CM | POA: Diagnosis not present

## 2024-12-07 DIAGNOSIS — R059 Cough, unspecified: Secondary | ICD-10-CM | POA: Diagnosis present

## 2024-12-07 LAB — CBC WITH DIFFERENTIAL/PLATELET
Abs Immature Granulocytes: 0.01 K/uL (ref 0.00–0.07)
Basophils Absolute: 0.1 K/uL (ref 0.0–0.1)
Basophils Relative: 1 %
Eosinophils Absolute: 0.2 K/uL (ref 0.0–0.5)
Eosinophils Relative: 3 %
HCT: 42.2 % (ref 36.0–46.0)
Hemoglobin: 13 g/dL (ref 12.0–15.0)
Immature Granulocytes: 0 %
Lymphocytes Relative: 51 %
Lymphs Abs: 2.6 K/uL (ref 0.7–4.0)
MCH: 27.6 pg (ref 26.0–34.0)
MCHC: 30.8 g/dL (ref 30.0–36.0)
MCV: 89.6 fL (ref 80.0–100.0)
Monocytes Absolute: 0.5 K/uL (ref 0.1–1.0)
Monocytes Relative: 11 %
Neutro Abs: 1.7 K/uL (ref 1.7–7.7)
Neutrophils Relative %: 34 %
Platelets: 228 K/uL (ref 150–400)
RBC: 4.71 MIL/uL (ref 3.87–5.11)
RDW: 16.9 % — ABNORMAL HIGH (ref 11.5–15.5)
WBC: 5.1 K/uL (ref 4.0–10.5)
nRBC: 0 % (ref 0.0–0.2)

## 2024-12-07 LAB — BASIC METABOLIC PANEL WITH GFR
Anion gap: 9 (ref 5–15)
BUN: 13 mg/dL (ref 6–20)
CO2: 32 mmol/L (ref 22–32)
Calcium: 9.1 mg/dL (ref 8.9–10.3)
Chloride: 101 mmol/L (ref 98–111)
Creatinine, Ser: 0.85 mg/dL (ref 0.44–1.00)
GFR, Estimated: >60 mL/min
Glucose, Bld: 90 mg/dL (ref 70–99)
Potassium: 4.1 mmol/L (ref 3.5–5.1)
Sodium: 142 mmol/L (ref 135–145)

## 2024-12-07 LAB — RESP PANEL BY RT-PCR (RSV, FLU A&B, COVID)  RVPGX2
Influenza A by PCR: NEGATIVE
Influenza B by PCR: NEGATIVE
Resp Syncytial Virus by PCR: NEGATIVE
SARS Coronavirus 2 by RT PCR: NEGATIVE

## 2024-12-07 LAB — TROPONIN T, HIGH SENSITIVITY: Troponin T High Sensitivity: <15 ng/L (ref 0–19)

## 2024-12-07 MED ORDER — GUAIFENESIN-DM 100-10 MG/5ML PO SYRP: 5.0000 mL | ORAL_SOLUTION | ORAL | 0 refills | Status: AC | PRN

## 2024-12-07 MED ORDER — ALBUTEROL SULFATE HFA 108 (90 BASE) MCG/ACT IN AERS: 1.0000 | INHALATION_SPRAY | Freq: Four times a day (QID) | RESPIRATORY_TRACT | 0 refills | Status: AC | PRN

## 2024-12-07 NOTE — ED Provider Notes (Signed)
 " [place] EMERGENCY DEPARTMENT AT [facility] Provider Note   CSN: [id number] Arrival date & time: 12/07/24  1009     Patient presents with: Cough   Philip Bouie-Lele is a 53 y.o. female with a past medical history of hypothyroidism, HTN, asthma presents to Emergency Department for evaluation of productive cough, nasal congestion for past 3 days.  Today, she woke up with a sore chest.  Also endorses shortness of breath since symptom onset.  Has been using inhaler once a day without improvement.  Normally does not use inhaler daily.  Daughter sick with similar symptoms.  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  No known fever. Denies NVD.    Cough      Prior to Admission medications  Medication Sig Start Date End Date Taking? Authorizing Provider  guaiFENesin -dextromethorphan (ROBITUSSIN DM) 100-10 MG/5ML syrup Take 5 mLs by mouth every 4 (four) hours as needed for cough. 12/07/24  Yes Minnie Tinnie BRAVO, PA  amLODipine (NORVASC) 10 MG tablet Take 10 mg by mouth daily. 09/29/21   [provider]  chlorthalidone (HYGROTON) 25 MG tablet Take 25 mg by mouth daily. 01/13/23   [provider]  CRESTOR 5 MG tablet Take 5 mg by mouth daily. 09/19/24   [provider]  levothyroxine (SYNTHROID) 200 MCG tablet Take 300 mcg by mouth every morning. 09/29/21   [provider]  levothyroxine (SYNTHROID) 25 MCG tablet Take 25 mcg by mouth every morning. 09/29/21   [provider]  loratadine (CLARITIN) 10 MG tablet Take 10 mg by mouth daily as needed for allergies.    [provider]  methocarbamol  (ROBAXIN ) 500 MG tablet Take 1 tablet (500 mg total) by mouth 2 (two) times daily. 10/10/24   Beverley Leita LABOR, PA-C  Multiple Vitamin (MULTIVITAMIN WITH MINERALS) TABS tablet Take 1 tablet by mouth daily.    [provider]  naproxen  (NAPROSYN ) 500 MG tablet Take 1 tablet (500 mg total) by mouth 2 (two) times daily. 10/10/24    Beverley Leita LABOR, PA-C  PEG-KCl-NaCl-NaSulf-Na Asc-C (PLENVU ) 140 g SOLR Use as directed for colonoscopy prep. 12/03/21   Beather Delon Gibson, PA  Semaglutide -Weight Management 1 MG/0.5ML SOAJ Inject 1 mg into the skin once a week as directed. 09/06/22   Anderson, Takela N, FNP  Semaglutide -Weight Management 1 MG/0.5ML SOAJ Inject 1 mg into the skin once a week. Patient not taking: Reported on 02/28/2024 11/17/22     Semaglutide -Weight Management 2.4 MG/0.75ML SOAJ Inject 2.4 mg into the skin once a week. Patient not taking: Reported on 02/28/2024 11/17/22       Allergies: Sulfa antibiotics and Penicillins    Review of Systems  Respiratory:  Positive for cough.     Updated Vital Signs BP (!) 150/100 (BP Location: Right Arm)   Pulse 73   Temp 98 F (36.7 C) (Oral)   Resp 20   SpO2 92%   Physical Exam Vitals and nursing note reviewed.  Constitutional:      General: She is not in acute distress.    Appearance: Normal appearance.  HENT:     Head: Normocephalic and atraumatic.  Eyes:     Conjunctiva/sclera: Conjunctivae normal.  Cardiovascular:     Rate and Rhythm: Normal rate.  Pulmonary:     Effort: Pulmonary effort is normal. No respiratory distress.     Breath sounds: Normal breath sounds.     Comments: Speaking in full complete sentences without difficulty.  Maintain oxygen saturation without  supplementation.  No signs of respiratory distress Chest:     Chest wall: Tenderness present.  Skin:    Coloration: Skin is not jaundiced or pale.  Neurological:     Mental Status: She is alert. Mental status is at baseline.     (all labs ordered are listed, but only abnormal results are displayed) Labs Reviewed  CBC WITH DIFFERENTIAL/PLATELET - Abnormal; Notable for the following components:      Result Value   RDW 16.9 (*)    All other components within normal limits  RESP PANEL BY RT-PCR (RSV, FLU A&B, COVID)  [id number]  BASIC METABOLIC PANEL WITH GFR  TROPONIN T, HIGH  SENSITIVITY  TROPONIN T, HIGH SENSITIVITY    EKG: None  Radiology: DG Chest 2 View Result Date: 12/07/2024 CLINICAL DATA:  Cough for several days EXAM: CHEST - 2 VIEW COMPARISON:  None Available. FINDINGS: Mild cardiomegaly. Both lungs are clear. The visualized skeletal structures are unremarkable. IMPRESSION: No active cardiopulmonary disease. Electronically Signed   By: Lynwood Landy Raddle M.D.   On: 12/07/2024 14:43     Medications Ordered in the ED - No data to display                                  Medical Decision Making Amount and/or Complexity of Data Reviewed Labs: ordered. Radiology: ordered.  Risk OTC drugs. Prescription drug management.   Patient presents to the ED for concern of cough, congestion, chest tightness, this involves an extensive number of treatment options, and is a complaint that carries with it a high risk of complications and morbidity.  The differential diagnosis includes costochondritis, ACS, pleuritic chest pain, viral URI, pneumonia   Co morbidities that complicate the patient evaluation  Asthma   Additional history obtained:  Additional history obtained from Nursing   External records from outside source obtained and reviewed including triage note   Lab Tests:  I Ordered, and personally interpreted labs.  The pertinent results include:   Troponin negative No leukocytosis Respiratory panel negative   Imaging Studies ordered:  I ordered imaging studies including chest x-ray I independently visualized and interpreted imaging which showed no active cardiopulmonary disease I agree with the radiologist interpretation   Cardiac Monitoring:  The patient was maintained on a cardiac monitor.  I personally viewed and interpreted the cardiac monitored which showed an underlying rhythm of: NSR with no ischemic changes   Medicines ordered and prescription drug management:  I ordered medication including Robitussin DM for cough,  congestion I have reviewed the patients home medicines and have made adjustments as needed     Problem List / ED Course:  Cough Congestion Vital signs hemodynamic stable no fever or tachycardia Respiratory panel negative Chest x-ray without pneumonia Lung sounds CTAB with no wheezing.  Does not require DuoNeb at this time. Will provide prescription for Robitussin DM Discussed OTC symptomatic treatment at home CP Chest pain is reproducible and likely secondary to increased coughing from viral infection Troponin is negative.  EKG without ischemic changes.  Do not need second troponin as it been greater than 6 hours since symptom onset She is mildly hypertensive 150/100 however low suspicion for dissection with no radiation of pain into back.  Pain is reproducible.  No neurological deficits.  No difference in blood pressures of BUE.   No history of traumatic injury.  Low suspicion for sternal fracture, traumatic injury to chest SHOB No  signs of respiratory distress.  Maintain oxygen saturation without supplementation. Did consider PE although symptoms are more consistent with viral infection.  She has absolutely no risk factors.  No tachycardia nor hypotension while in ED.  Low suspicion for PE. I personally ambulated patient with no hypoxia nor signs of respiratory distress Patient asked for refill of inhaler.  Provided prescription.  I also provided patient with spacer.   Reevaluation:  After the interventions noted above, I reevaluated the patient and found that they have :stayed the same    Dispostion:  After consideration of the diagnostic results and the patients response to treatment, I feel that the patent would benefit from outpatient management symptomatic treatment.   Discussed ED workup, disposition, return to ED precautions with patient who expresses understanding agrees with plan.  All questions answered to their satisfaction.  They are agreeable to plan.  Discharge  instructions provided on paperwork  Final diagnoses:  Viral URI with cough    ED Discharge Orders          Ordered    guaiFENesin -dextromethorphan (ROBITUSSIN DM) 100-10 MG/5ML syrup  Every 4 hours PRN        12/07/24 1625             Minnie Tinnie BRAVO, PA 12/07/24 2256    Tegeler, Lonni PARAS, MD 12/08/24 1605  "

## 2024-12-07 NOTE — Discharge Instructions (Signed)
 Thank for letting us  evaluate you today.  You are negative for COVID, flu, RSV.  Your chest x-ray did not show any pneumonia nor fluid.  Your electrolytes are within normal limits.  Your white blood cell count is not severely increased.  Your vital signs are normal.  Your cardiac function is normal.  Return to Emergency Department for experience chest pain, shortness of breath, worsening symptoms

## 2024-12-07 NOTE — ED Triage Notes (Signed)
 Cough for several days. Hx of asthma, using inhaler without relief.

## 2024-12-07 NOTE — ED Notes (Signed)
 Checked SPO2 while standing and it was 95%. No respiratory distress noted.

## 2024-12-11 ENCOUNTER — Other Ambulatory Visit

## 2024-12-20 ENCOUNTER — Other Ambulatory Visit: Payer: Self-pay | Admitting: Medical Genetics

## 2024-12-20 ENCOUNTER — Ambulatory Visit
Admission: RE | Admit: 2024-12-20 | Discharge: 2024-12-20 | Disposition: A | Discharge status: Home / Self Care | Source: Ambulatory Visit | Attending: Nurse Practitioner | Admitting: Nurse Practitioner

## 2024-12-20 DIAGNOSIS — Z1231 Encounter for screening mammogram for malignant neoplasm of breast: Secondary | ICD-10-CM

## 2024-12-20 DIAGNOSIS — Z006 Encounter for examination for normal comparison and control in clinical research program: Secondary | ICD-10-CM

## 2024-12-21 ENCOUNTER — Encounter: Payer: Self-pay | Admitting: Nurse Practitioner

## 2024-12-30 ENCOUNTER — Other Ambulatory Visit: Payer: Self-pay

## 2024-12-30 ENCOUNTER — Encounter (HOSPITAL_BASED_OUTPATIENT_CLINIC_OR_DEPARTMENT_OTHER): Payer: Self-pay

## 2024-12-30 ENCOUNTER — Emergency Department (HOSPITAL_BASED_OUTPATIENT_CLINIC_OR_DEPARTMENT_OTHER)
Admission: EM | Admit: 2024-12-30 | Discharge: 2024-12-30 | Disposition: A | Discharge status: Home / Self Care | Attending: Emergency Medicine | Admitting: Emergency Medicine

## 2024-12-30 ENCOUNTER — Emergency Department (HOSPITAL_BASED_OUTPATIENT_CLINIC_OR_DEPARTMENT_OTHER): Admitting: Radiology

## 2024-12-30 DIAGNOSIS — R0789 Other chest pain: Secondary | ICD-10-CM | POA: Diagnosis present

## 2024-12-30 DIAGNOSIS — J45909 Unspecified asthma, uncomplicated: Secondary | ICD-10-CM | POA: Diagnosis not present

## 2024-12-30 DIAGNOSIS — Z79899 Other long term (current) drug therapy: Secondary | ICD-10-CM | POA: Diagnosis not present

## 2024-12-30 DIAGNOSIS — E039 Hypothyroidism, unspecified: Secondary | ICD-10-CM | POA: Diagnosis not present

## 2024-12-30 DIAGNOSIS — I10 Essential (primary) hypertension: Secondary | ICD-10-CM | POA: Insufficient documentation

## 2024-12-30 DIAGNOSIS — Z7951 Long term (current) use of inhaled steroids: Secondary | ICD-10-CM | POA: Diagnosis not present

## 2024-12-30 LAB — CBC
HCT: 38.4 % (ref 36.0–46.0)
Hemoglobin: 12.3 g/dL (ref 12.0–15.0)
MCH: 28.1 pg (ref 26.0–34.0)
MCHC: 32 g/dL (ref 30.0–36.0)
MCV: 87.9 fL (ref 80.0–100.0)
Platelets: 255 K/uL (ref 150–400)
RBC: 4.37 MIL/uL (ref 3.87–5.11)
RDW: 17.2 % — ABNORMAL HIGH (ref 11.5–15.5)
WBC: 8.2 K/uL (ref 4.0–10.5)
nRBC: 0 % (ref 0.0–0.2)

## 2024-12-30 LAB — BASIC METABOLIC PANEL WITH GFR
Anion gap: 10 (ref 5–15)
BUN: 12 mg/dL (ref 6–20)
CO2: 30 mmol/L (ref 22–32)
Calcium: 9.6 mg/dL (ref 8.9–10.3)
Chloride: 101 mmol/L (ref 98–111)
Creatinine, Ser: 0.78 mg/dL (ref 0.44–1.00)
GFR, Estimated: >60 mL/min
Glucose, Bld: 107 mg/dL — ABNORMAL HIGH (ref 70–99)
Potassium: 3.6 mmol/L (ref 3.5–5.1)
Sodium: 142 mmol/L (ref 135–145)

## 2024-12-30 LAB — TROPONIN T, HIGH SENSITIVITY: Troponin T High Sensitivity: <15 ng/L (ref 0–19)

## 2024-12-30 NOTE — ED Triage Notes (Signed)
 Presents to ED for intermittent chest pain x3 days. Had PT on 1/14 and has had pain since.

## 2024-12-30 NOTE — Discharge Instructions (Signed)
 It was a pleasure meeting with you today.  As we discussed your blood work, EKG, and chest x-ray showed no acute findings.  It is reassuring that your chest pain is reproducible with palpation and movement, making it less likely that this is musculoskeletal in nature.  I recommend over-the-counter ibuprofen , rest, ice/heat.  Follow-up with your primary care physician and notify your physical therapist so they can change therapy exercises accordingly.  Return to ED if pain worsens, shortness of breath develops, you experience fainting spells or dizziness, or any other new concerning symptoms develop.

## 2024-12-30 NOTE — ED Provider Notes (Signed)
 " [place] EMERGENCY DEPARTMENT AT [facility] Provider Note   CSN: [id number] Arrival date & time: 12/30/24  1841     Patient presents with: Chest Pain   April Ortega is a 54 y.o. female.  With pertinent medical history of hypertension, asthma, anemia, hypothyroidism, obesity.   Patient is here for evaluation of anterior chest wall pain that began about 1 week ago.  She first noticed it the day after physical therapy.  She is in physical therapy after sustaining a fall at work in October when she injured her neck and right arm.  She has been doing exercises that include movement of her arms and turning of her neck.  She works as a advertising account executive.  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  She also had a persistent cough since the middle of December that resolved about 1 week ago.  She denies any recent falls or injuries to the chest.  She denies shortness of breath, dizziness, syncope, nausea, vomiting, or leg swelling.  Pain is reproducible with palpation and worse with certain movements.  The history is provided by the patient.  Chest Pain Pain location:  Substernal area      Prior to Admission medications  Medication Sig Start Date End Date Taking? Authorizing Provider  albuterol  (VENTOLIN  HFA) 108 (90 Base) MCG/ACT inhaler Inhale 1-2 puffs into the lungs every 6 (six) hours as needed for wheezing or shortness of breath. 12/07/24   Minnie Tinnie BRAVO, PA  amLODipine (NORVASC) 10 MG tablet Take 10 mg by mouth daily. 09/29/21   [provider]  chlorthalidone (HYGROTON) 25 MG tablet Take 25 mg by mouth daily. 01/13/23   [provider]  CRESTOR 5 MG tablet Take 5 mg by mouth daily. 09/19/24   [provider]  guaiFENesin -dextromethorphan (ROBITUSSIN DM) 100-10 MG/5ML syrup Take 5 mLs by mouth every 4 (four) hours as needed for cough. 12/07/24   Minnie Tinnie BRAVO, PA   levothyroxine (SYNTHROID) 200 MCG tablet Take 300 mcg by mouth every morning. 09/29/21   [provider]  levothyroxine (SYNTHROID) 25 MCG tablet Take 25 mcg by mouth every morning. 09/29/21   [provider]  loratadine (CLARITIN) 10 MG tablet Take 10 mg by mouth daily as needed for allergies.    [provider]  methocarbamol  (ROBAXIN ) 500 MG tablet Take 1 tablet (500 mg total) by mouth 2 (two) times daily. 10/10/24   Beverley Leita LABOR, PA-C  Multiple Vitamin (MULTIVITAMIN WITH MINERALS) TABS tablet Take 1 tablet by mouth daily.    [provider]  naproxen  (NAPROSYN ) 500 MG tablet Take 1 tablet (500 mg total) by mouth 2 (two) times daily. 10/10/24   Beverley Leita LABOR, PA-C  PEG-KCl-NaCl-NaSulf-Na Asc-C (PLENVU ) 140 g SOLR Use as directed for colonoscopy prep. 12/03/21   Beather Delon Gibson, PA  Semaglutide -Weight Management 1 MG/0.5ML SOAJ Inject 1 mg into the skin once a week as directed. 09/06/22   Lenon Nell SAILOR, FNP  Semaglutide -Weight Management 1 MG/0.5ML SOAJ Inject 1 mg into the skin once a week. Patient not taking: Reported on 02/28/2024 11/17/22     Semaglutide -Weight Management 2.4 MG/0.75ML SOAJ Inject 2.4 mg into the skin once a week. Patient not taking: Reported on 02/28/2024 11/17/22       Allergies: Sulfa antibiotics and Penicillins    Review of Systems  Cardiovascular:  Positive for chest pain.   Vitals:  12/30/24 1855 12/30/24 1925 12/30/24 2015 12/30/24 2045  BP:  (!) 144/79 (!) 145/97 122/71  Pulse:  74 69 68  Resp:  16 18 19   Temp:  98 F (36.7 C)  98.1 F (36.7 C)  TempSrc:  Oral  Oral  SpO2:  94% 97% 95%  Weight: (!) 164.7 kg     Height: 5' 5 (1.651 m)       Updated Vital Signs BP 122/71 (BP Location: Right Arm)   Pulse 68   Temp 98.1 F (36.7 C) (Oral)   Resp 19   Ht 5' 5 (1.651 m)   Wt (!) 164.7 kg   SpO2 95%   BMI 60.41 kg/m   Physical Exam Vitals and nursing note reviewed.  Constitutional:       General: She is not in acute distress.    Appearance: Normal appearance. She is obese. She is not ill-appearing, toxic-appearing or diaphoretic.  HENT:     Head: Normocephalic and atraumatic.     Nose: Nose normal.     Mouth/Throat:     Mouth: Mucous membranes are moist.  Eyes:     General: No scleral icterus.    Extraocular Movements: Extraocular movements intact.     Conjunctiva/sclera: Conjunctivae normal.  Cardiovascular:     Rate and Rhythm: Normal rate and regular rhythm.     Pulses: Normal pulses.     Heart sounds: Normal heart sounds.  Pulmonary:     Effort: Pulmonary effort is normal. No tachypnea, accessory muscle usage or respiratory distress.     Breath sounds: Normal breath sounds. No stridor. No decreased breath sounds, wheezing, rhonchi or rales.  Chest:     Chest wall: Tenderness present. No mass, deformity, crepitus or edema.  Abdominal:     General: Abdomen is flat. Bowel sounds are normal. There is no distension.     Palpations: Abdomen is soft.     Tenderness: There is no abdominal tenderness. There is no guarding.  Musculoskeletal:        General: Normal range of motion.     Cervical back: Normal range of motion.     Right lower leg: No tenderness. No edema.     Left lower leg: No tenderness. No edema.  Skin:    General: Skin is warm and dry.     Capillary Refill: Capillary refill takes less than 2 seconds.     Coloration: Skin is not jaundiced or pale.  Neurological:     Mental Status: She is alert and oriented to person, place, and time.     (all labs ordered are listed, but only abnormal results are displayed) Labs Reviewed  BASIC METABOLIC PANEL WITH GFR - Abnormal; Notable for the following components:      Result Value   Glucose, Bld 107 (*)    All other components within normal limits  CBC - Abnormal; Notable for the following components:   RDW 17.2 (*)    All other components within normal limits  TROPONIN T, HIGH SENSITIVITY  TROPONIN T,  HIGH SENSITIVITY    EKG: EKG Interpretation Date/Time:  Sunday December 30 2024 18:49:02 EST Ventricular Rate:  81 PR Interval:  174 QRS Duration:  90 QT Interval:  394 QTC Calculation: 457 R Axis:   257  Text Interpretation: Normal sinus rhythm Right superior axis deviation Low voltage QRS Cannot rule out Anterior infarct , age undetermined Abnormal ECG When compared with ECG of 07-Dec-2024 16:51, PREVIOUS ECG IS PRESENT Confirmed by Bari Flank (  [id number]) on 12/30/2024 8:24:01 PM  Radiology: DG Chest 2 View Result Date: 12/30/2024 EXAM: 2 VIEW(S) XRAY OF THE CHEST 12/30/2024 07:38:41 PM COMPARISON: 12/07/2024 CLINICAL HISTORY: Chest Pain Chest Pain Chest Pain Chest Pain Chest Pain Chest Pain FINDINGS: LUNGS AND PLEURA: Low lung volume. No focal pulmonary opacity. No pleural effusion. No pneumothorax. HEART AND MEDIASTINUM: Mild cardiomegaly. Central vascular prominence. BONES AND SOFT TISSUES: Thoracic spondylosis. No acute osseous abnormality. IMPRESSION: 1. No acute findings. 2. Mild cardiomegaly with central vascular prominence. Electronically signed by: Oneil Devonshire MD 12/30/2024 07:45 PM EST RP Workstation: [id number]    Procedures   Medications Ordered in the ED - No data to display   Patient presents to the ED for concern of anterior chest wall pain, this involves an extensive number of treatment options, and is a complaint that carries with it a high risk of complications and morbidity.  The differential diagnosis includes ACS, musculoskeletal, pneumonia, pneumothorax, fracture, soft tissue injury, PE, aortic dissection.  EKG and initial troponin are normal lowering suspicion for ACS.  Imaging shows no findings consistent with pneumonia, pneumothorax, fracture, or aortic dissection. Geneva PE score of 0, placing her in low risk group for PE.  PERC 1 due to age over 21.  Low suspicion for PE at this time.   Co morbidities that complicate the patient  evaluation  Obesity Hypertension   Additional history obtained:  Additional history obtained from EMR   External records from outside source obtained and reviewed including review of previous chest x-ray imaging and EKG for comparison.   Lab Tests:  I Ordered, and personally interpreted labs.  The pertinent results include: Initial troponin normal.   Imaging Studies ordered:  I ordered imaging studies including chest x-ray I independently visualized and interpreted imaging which showed no acute findings.  Mild cardiomegaly similar to previous studies. I agree with the radiologist interpretation   Cardiac Monitoring:  The patient was maintained on a cardiac monitor.  I personally viewed and interpreted the cardiac monitored which showed an underlying rhythm of: Sinus rhythm with no acute evidence of STEMI and tracing is similar to previous.   Problem List / ED Course:     Musculoskeletal anterior chest wall pain.  Initial troponin, EKG, and chest x-ray results are reassuring.  Physical exam consistent with musculoskeletal pain as chest pain is reproducible with palpation and movement. I do not feel additional emergent work up warranted at this time. Encouraged follow up with primary care and physical therapy for ongoing evaluation and management. Return precautions discussed and patient verbalized understanding.    Reevaluation:  After the interventions noted above, I reevaluated the patient and found that they have :improved   Dispostion:  After consideration of the diagnostic results and the patients response to treatment, I feel that the patent would benefit from supportive care in the home setting with continued monitoring of symptoms and conservative therapy with over-the-counter pain medication, rest, ice and heat as well as close follow-up with primary care and physical therapy.  Return precautions given.                              Geneva (Revised) Score: 0,  Geneva Score Interpretation: Low Risk Group: 7-9% incidence of pulmonary embolism from several studies PERC Score: 1, PERC Score Interpretation: If any criteria are positive, the PERC rule cannot be used to rule out PE in this patient Medical Decision Making Amount and/or Complexity of Data  Reviewed Labs: ordered. Radiology: ordered.   This note was produced using Electronics Engineer. While the provider has reviewed and verified all clinical information, transcription errors may remain.    Final diagnoses:  Anterior chest wall pain    ED Discharge Orders     None          Rosina Almarie LABOR, PA-C 12/31/24 0045    Bari Roxie HERO, DO 12/31/24 2340  "

## 2025-01-17 ENCOUNTER — Encounter (HOSPITAL_BASED_OUTPATIENT_CLINIC_OR_DEPARTMENT_OTHER): Payer: Self-pay | Admitting: Pulmonary Disease

## 2025-01-17 DIAGNOSIS — R5383 Other fatigue: Secondary | ICD-10-CM

## 2025-01-17 DIAGNOSIS — R0683 Snoring: Secondary | ICD-10-CM

## 2025-01-17 DIAGNOSIS — G4710 Hypersomnia, unspecified: Secondary | ICD-10-CM

## 2025-01-17 DIAGNOSIS — R0681 Apnea, not elsewhere classified: Secondary | ICD-10-CM
